# Patient Record
Sex: Female | Born: 1970
Health system: Southern US, Community
[De-identification: ages and names within clinical notes are randomized; demographics above are authoritative.]

## PROBLEM LIST (undated history)

## (undated) DIAGNOSIS — N189 Chronic kidney disease, unspecified: Secondary | ICD-10-CM

## (undated) DIAGNOSIS — N200 Calculus of kidney: Secondary | ICD-10-CM

## (undated) DIAGNOSIS — E079 Disorder of thyroid, unspecified: Secondary | ICD-10-CM

## (undated) HISTORY — PX: BREAST SURGERY: SHX581

## (undated) HISTORY — DX: Chronic kidney disease, unspecified: N18.9

## (undated) HISTORY — DX: Calculus of kidney: N20.0

## (undated) HISTORY — DX: Disorder of thyroid, unspecified: E07.9

---

## 1997-10-20 ENCOUNTER — Other Ambulatory Visit: Admission: RE | Admit: 1997-10-20 | Discharge: 1997-10-20 | Payer: Self-pay | Admitting: Obstetrics and Gynecology

## 1998-05-06 ENCOUNTER — Inpatient Hospital Stay (HOSPITAL_COMMUNITY): Admission: AD | Admit: 1998-05-06 | Discharge: 1998-05-06 | Payer: Self-pay | Admitting: Obstetrics and Gynecology

## 1998-05-12 ENCOUNTER — Inpatient Hospital Stay (HOSPITAL_COMMUNITY): Admission: AD | Admit: 1998-05-12 | Discharge: 1998-05-14 | Payer: Self-pay | Admitting: Obstetrics and Gynecology

## 1998-06-12 ENCOUNTER — Other Ambulatory Visit: Admission: RE | Admit: 1998-06-12 | Discharge: 1998-06-12 | Payer: Self-pay | Admitting: Obstetrics and Gynecology

## 1999-06-14 ENCOUNTER — Encounter: Admission: RE | Admit: 1999-06-14 | Discharge: 1999-06-14 | Payer: Self-pay | Admitting: *Deleted

## 1999-06-14 ENCOUNTER — Encounter: Payer: Self-pay | Admitting: Allergy and Immunology

## 2000-02-05 ENCOUNTER — Other Ambulatory Visit: Admission: RE | Admit: 2000-02-05 | Discharge: 2000-02-05 | Payer: Self-pay | Admitting: Obstetrics and Gynecology

## 2001-09-24 ENCOUNTER — Other Ambulatory Visit: Admission: RE | Admit: 2001-09-24 | Discharge: 2001-09-24 | Payer: Self-pay | Admitting: Obstetrics and Gynecology

## 2003-01-23 ENCOUNTER — Other Ambulatory Visit: Admission: RE | Admit: 2003-01-23 | Discharge: 2003-01-23 | Payer: Self-pay | Admitting: Obstetrics and Gynecology

## 2005-12-03 ENCOUNTER — Ambulatory Visit: Payer: Self-pay | Admitting: "Endocrinology

## 2008-02-10 ENCOUNTER — Ambulatory Visit: Payer: Self-pay | Admitting: Internal Medicine

## 2009-01-05 ENCOUNTER — Ambulatory Visit: Payer: Self-pay | Admitting: Internal Medicine

## 2009-02-12 ENCOUNTER — Ambulatory Visit: Payer: Self-pay | Admitting: Internal Medicine

## 2009-03-20 ENCOUNTER — Ambulatory Visit: Payer: Self-pay | Admitting: Internal Medicine

## 2009-09-27 ENCOUNTER — Ambulatory Visit: Payer: Self-pay | Admitting: Internal Medicine

## 2009-10-05 ENCOUNTER — Ambulatory Visit: Payer: Self-pay | Admitting: Internal Medicine

## 2009-11-23 ENCOUNTER — Ambulatory Visit: Payer: Self-pay | Admitting: Internal Medicine

## 2010-01-08 ENCOUNTER — Ambulatory Visit: Payer: Self-pay | Admitting: Internal Medicine

## 2010-03-15 ENCOUNTER — Ambulatory Visit: Payer: Self-pay | Admitting: Internal Medicine

## 2010-11-20 ENCOUNTER — Telehealth: Payer: Self-pay

## 2010-11-20 MED ORDER — AZITHROMYCIN 250 MG PO TABS: ORAL_TABLET | ORAL | Status: AC

## 2010-11-27 NOTE — Telephone Encounter (Signed)
rx called to pharmacy, as ordered per Dr. Lenord Fellers

## 2010-11-28 ENCOUNTER — Telehealth: Payer: Self-pay | Admitting: Internal Medicine

## 2010-11-28 NOTE — Telephone Encounter (Signed)
Pull charts so I can get it correct. Is she going to pick these up? We need correct mailing address for all.

## 2010-11-29 NOTE — Telephone Encounter (Signed)
Called pt and advised her RXs are ready to be picked up. She stated she will come by today

## 2011-04-04 ENCOUNTER — Telehealth: Payer: Self-pay | Admitting: Internal Medicine

## 2011-04-04 NOTE — Telephone Encounter (Signed)
Called patient and left voicemail that we have samples of Pristiq for her to pick up.  Also phoned in one refill of Sythroid and patient needs to have TSH checked per Dr. Lenord Fellers.

## 2011-04-04 NOTE — Telephone Encounter (Signed)
Needs TSH checked . Have samples of Pristiq 50 mg daily.

## 2011-04-09 DIAGNOSIS — E039 Hypothyroidism, unspecified: Secondary | ICD-10-CM

## 2011-06-23 ENCOUNTER — Telehealth: Payer: Self-pay | Admitting: Internal Medicine

## 2011-06-23 NOTE — Telephone Encounter (Signed)
Pt advised, per Dr. Lenord Fellers, to call gynecologist regarding birth control meds.  Pt verbalized understanding.

## 2011-07-31 ENCOUNTER — Telehealth: Payer: Self-pay | Admitting: Internal Medicine

## 2011-07-31 DIAGNOSIS — E039 Hypothyroidism, unspecified: Secondary | ICD-10-CM | POA: Insufficient documentation

## 2011-07-31 DIAGNOSIS — G4700 Insomnia, unspecified: Secondary | ICD-10-CM

## 2011-07-31 NOTE — Telephone Encounter (Signed)
Patient complaining of anxiety and insomnia. Has not been able to sleep in several weeks. Has been remodeling her kitchen. Call in Xanax 0.5 mg #30 one by mouth each bedtime to [facility]. She is aware she needs to come in in the near future to have TSH checked.

## 2011-08-15 ENCOUNTER — Telehealth: Payer: Self-pay | Admitting: Internal Medicine

## 2011-08-15 MED ORDER — AMPHETAMINE-DEXTROAMPHET ER 30 MG PO CP24: 30.0000 mg | ORAL_CAPSULE | ORAL | Status: DC

## 2011-08-15 NOTE — Telephone Encounter (Signed)
RX's written for both Jennifer Nicholson and Jennifer Nicholson for Adderall.  Written RX in an envelope left in the mailbox at the front door for [place] to pick up.  Voice mail left for patient.

## 2011-08-18 ENCOUNTER — Other Ambulatory Visit: Payer: Self-pay | Admitting: Internal Medicine

## 2011-08-18 DIAGNOSIS — E049 Nontoxic goiter, unspecified: Secondary | ICD-10-CM

## 2011-08-19 LAB — TSH: TSH: 0.543 u[IU]/mL (ref 0.350–4.500)

## 2011-09-02 ENCOUNTER — Other Ambulatory Visit: Payer: Self-pay

## 2011-09-02 ENCOUNTER — Telehealth: Payer: Self-pay | Admitting: Internal Medicine

## 2011-09-02 DIAGNOSIS — R41840 Attention and concentration deficit: Secondary | ICD-10-CM

## 2011-09-02 MED ORDER — LEVOTHYROXINE SODIUM 100 MCG PO TABS: 100.0000 ug | ORAL_TABLET | Freq: Every day | ORAL | Status: DC

## 2011-09-02 MED ORDER — AMPHETAMINE-DEXTROAMPHETAMINE 30 MG PO TABS: 30.0000 mg | ORAL_TABLET | Freq: Every day | ORAL | Status: DC

## 2011-09-02 NOTE — Telephone Encounter (Addendum)
Patient needs refill on Adderall( not XR) 30 mg daily. Prescription written for #30 with no refill. TSH was drawn recently in followup of hypothyroidism.

## 2011-09-04 ENCOUNTER — Other Ambulatory Visit: Payer: Self-pay | Admitting: Internal Medicine

## 2011-09-04 NOTE — Telephone Encounter (Signed)
Recent TSH within normal limits strong in April. Continue Synthroid 0.1 mg daily. Call in Synthroid 0.1 mg #30 with 5 refills 1 by mouth daily to [facility] on [facility]

## 2011-12-25 ENCOUNTER — Telehealth: Payer: Self-pay | Admitting: Internal Medicine

## 2011-12-25 NOTE — Telephone Encounter (Signed)
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 Patient says they started after boyfriend had a boil a couple of years ago. She has not had any recent outbreaks. Today has carbuncle right TM which is draining yellow-green pus. She started on doxycycline which she had on hand from previous infections. Has only been taking it since August 18. Has applied hot compresses to chin area. She is concerned about these recurrences. She does have Bactroban to put in her nostrils but has not been doing that recently. Recommended that she bathes daily in Hibiclens soap, wash linens in hot water, continue to put Bactroban in nostrils at bedtime. Continue doxycycline for 2-3 weeks.

## 2012-01-22 ENCOUNTER — Telehealth: Payer: Self-pay | Admitting: Internal Medicine

## 2012-01-22 MED ORDER — AMPHETAMINE-DEXTROAMPHETAMINE 30 MG PO TABS: 30.0000 mg | ORAL_TABLET | Freq: Every day | ORAL | Status: DC

## 2012-01-22 MED ORDER — ALPRAZOLAM 0.5 MG PO TABS: 0.5000 mg | ORAL_TABLET | Freq: Every evening | ORAL | Status: DC | PRN

## 2012-02-09 ENCOUNTER — Other Ambulatory Visit: Payer: Self-pay

## 2012-02-09 MED ORDER — LORAZEPAM 2 MG PO TABS: 2.0000 mg | ORAL_TABLET | Freq: Every evening | ORAL | Status: DC | PRN

## 2012-04-06 ENCOUNTER — Telehealth: Payer: Self-pay | Admitting: Internal Medicine

## 2012-04-06 NOTE — Telephone Encounter (Signed)
Call in Sterapred DS 10 mg 6 day dosepack.n Take as directed.

## 2012-04-06 NOTE — Telephone Encounter (Signed)
Spoke w/Stephanie @ [facility] [phone] and requested Sterapred as advised by Dr. Lenord Fellers.  Spoke w/patient to advise Rx has been called in.

## 2012-05-07 ENCOUNTER — Other Ambulatory Visit: Payer: Self-pay

## 2012-05-07 MED ORDER — LEVOTHYROXINE SODIUM 100 MCG PO TABS: 100.0000 ug | ORAL_TABLET | Freq: Every day | ORAL | Status: DC

## 2012-05-31 ENCOUNTER — Telehealth: Payer: Self-pay | Admitting: Internal Medicine

## 2012-05-31 NOTE — Telephone Encounter (Signed)
Patient called with a two-week history of URI symptoms. Coughing up discolored sputum. Has had laryngitis malaise and fatigue but no documented fever. Call in Zithromax Z-PAK to [facility].

## 2012-10-08 ENCOUNTER — Other Ambulatory Visit: Payer: Self-pay

## 2012-10-08 ENCOUNTER — Other Ambulatory Visit: Payer: Self-pay | Admitting: Internal Medicine

## 2012-10-08 MED ORDER — SYNTHROID 100 MCG PO TABS: 100.0000 ug | ORAL_TABLET | Freq: Every day | ORAL | Status: DC

## 2012-10-08 NOTE — Telephone Encounter (Signed)
Spoke with patient today, and she states she has been out of Synthroid 5 or 6 days. Requesting refill.

## 2012-10-08 NOTE — Telephone Encounter (Signed)
Needs TSH checked. Please call her

## 2012-10-08 NOTE — Telephone Encounter (Signed)
OK per Dr. Lenord Fellers to refill for 30 days only. Must come in for TSH before running out. She voices understanding.

## 2012-11-02 ENCOUNTER — Other Ambulatory Visit (INDEPENDENT_AMBULATORY_CARE_PROVIDER_SITE_OTHER): Payer: No Typology Code available for payment source | Admitting: Internal Medicine

## 2012-11-02 ENCOUNTER — Other Ambulatory Visit: Payer: Self-pay | Admitting: Internal Medicine

## 2012-11-02 DIAGNOSIS — E039 Hypothyroidism, unspecified: Secondary | ICD-10-CM

## 2012-11-03 ENCOUNTER — Telehealth: Payer: Self-pay | Admitting: Internal Medicine

## 2012-11-03 LAB — TSH: TSH: 0.013 u[IU]/mL — ABNORMAL LOW (ref 0.350–4.500)

## 2012-11-03 MED ORDER — LEVOTHYROXINE SODIUM 88 MCG PO TABS: 88.0000 ug | ORAL_TABLET | Freq: Every day | ORAL | Status: DC

## 2012-11-03 NOTE — Telephone Encounter (Signed)
Pt not feeling well has weight gain, hot flashes, arthralgias. Is on Synthroid 0.1  mg daily and TSH done yesterday is low suggesting over replacement of thyroid hormone. Has seen Dr. Fransico Michael in the past who advised low dose thyroid replacement. We are going to decrease dose to 0.088 mg daily and follow up in 6 weeks. She will also have sed rate, CBC,  CCP, ANA, RF,  C-met, Fe TIBC added to labs done yesterday. Says GYN has her on hormone replacement.

## 2012-11-04 ENCOUNTER — Other Ambulatory Visit: Payer: Self-pay

## 2012-11-04 LAB — COMPREHENSIVE METABOLIC PANEL
ALT: 64 U/L — ABNORMAL HIGH (ref 0–35)
CO2: 25 mEq/L (ref 19–32)
Calcium: 9.1 mg/dL (ref 8.4–10.5)
Chloride: 104 mEq/L (ref 96–112)
Creat: 0.79 mg/dL (ref 0.50–1.10)
Total Protein: 6.5 g/dL (ref 6.0–8.3)

## 2012-11-04 LAB — IRON AND TIBC: %SAT: 18 % — ABNORMAL LOW (ref 20–55)

## 2012-11-04 LAB — RHEUMATOID FACTOR: Rhuematoid fact SerPl-aCnc: <10 IU/mL (ref <=14)

## 2012-11-16 ENCOUNTER — Telehealth: Payer: Self-pay

## 2012-11-16 DIAGNOSIS — A4902 Methicillin resistant Staphylococcus aureus infection, unspecified site: Secondary | ICD-10-CM

## 2012-11-16 MED ORDER — DOXYCYCLINE HYCLATE 100 MG PO CAPS: 100.0000 mg | ORAL_CAPSULE | Freq: Two times a day (BID) | ORAL | Status: DC

## 2012-11-16 NOTE — Telephone Encounter (Signed)
Patient calls today stating she has a MRSA infection on her inner thigh. Reddened tender area that hurts when she walks. Cannot come in for a visit. Per verbal order Dr. Lenord Fellers, rx Doxycycline 100mg  po bid x 15 days. Sent to [facility]. Patient notified.

## 2012-12-31 ENCOUNTER — Other Ambulatory Visit: Payer: Self-pay

## 2012-12-31 MED ORDER — ALPRAZOLAM 0.5 MG PO TABS: 0.5000 mg | ORAL_TABLET | Freq: Two times a day (BID) | ORAL | Status: DC | PRN

## 2013-02-22 ENCOUNTER — Other Ambulatory Visit: Payer: No Typology Code available for payment source

## 2013-02-22 ENCOUNTER — Telehealth: Payer: Self-pay | Admitting: Endocrinology

## 2013-02-22 NOTE — Telephone Encounter (Signed)
Pt no showed today's lab appt, has follow up scheduled with Dr. Lucianne Muss 02/24/13  Roanna Raider

## 2013-02-24 ENCOUNTER — Ambulatory Visit (INDEPENDENT_AMBULATORY_CARE_PROVIDER_SITE_OTHER): Payer: No Typology Code available for payment source | Admitting: Endocrinology

## 2013-02-24 ENCOUNTER — Encounter: Payer: Self-pay | Admitting: Endocrinology

## 2013-02-24 VITALS — BP 132/88 | HR 88 | Temp 98.5°F | Resp 12 | Ht 63.0 in | Wt 169.0 lb

## 2013-02-24 DIAGNOSIS — D509 Iron deficiency anemia, unspecified: Secondary | ICD-10-CM

## 2013-02-24 DIAGNOSIS — E039 Hypothyroidism, unspecified: Secondary | ICD-10-CM

## 2013-02-24 DIAGNOSIS — N951 Menopausal and female climacteric states: Secondary | ICD-10-CM

## 2013-02-24 LAB — CBC WITH DIFFERENTIAL/PLATELET
Basophils Absolute: 0 10*3/uL (ref 0.0–0.1)
Eosinophils Relative: 1.1 % (ref 0.0–5.0)
Lymphocytes Relative: 19.2 % (ref 12.0–46.0)
Lymphs Abs: 1.9 10*3/uL (ref 0.7–4.0)
Monocytes Relative: 5.3 % (ref 3.0–12.0)
Neutrophils Relative %: 74.1 % (ref 43.0–77.0)
Platelets: 179 10*3/uL (ref 150.0–400.0)
RDW: 13.2 % (ref 11.5–14.6)
WBC: 10 10*3/uL (ref 4.5–10.5)

## 2013-02-24 NOTE — Progress Notes (Signed)
Reason for Appointment:  Hypothyroidism, new visit   History of Present Illness:   Problem 1:  HYPOTHYROIDISM: She apparently had a goiter initially several years ago and then subsequently was told that she was hypothyroid also. No records are available of baseline testing However her on 6-7 years ago she started having symptoms of significant fatigue, feeling sleepy, having brittle hair and dry skin. She did not have any complaints of cold intolerance She was started on thyroid supplementation at some point but she felt only slightly better with thyroid supplements Her dose has been ranging from 88-112 mcg Synthroid  She now comes in complaining of feeling exhausted. She feels sleepy during the day and has difficulty waking up in the morning She also has had progressive weight gain and she thinks she has gained about 40 pounds this year She he has not been able to exercise regularly but tries to go for walks periodically She also complains of her muscles being sore and tender Complains of feeling hot most of the time  She had a TSH done in July and because this was low her dose was reduced from 100 down to 88 mcg but she has not had any followup levels done  No visits with results within 1 Week(s) from this visit. Latest known visit with results is:  Lab on 11/02/2012  Component Date Value Range Status  . TSH 11/02/2012 0.013* 0.350 - 4.500 uIU/mL Final     PROBLEM #2:  She is complaining about progressive weight gain. Does not have any weakness in her legs, easy bruising, facial hair or hair loss    Past Medical History  Diagnosis Date  . Thyroid disease     History reviewed. No pertinent past surgical history.  Family History  Problem Relation Age of Onset  . Thyroid disease Mother   . Thyroid disease Sister   . Thyroid disease Maternal Grandmother     Social History:  reports that she has never smoked. She has never used smokeless tobacco. Her alcohol and drug  histories are not on file.  Allergies:  Allergies  Allergen Reactions  . Hydrocodone Itching      Medication List       This list is accurate as of: 02/24/13  2:25 PM.  Always use your most recent med list.               DYMISTA 137-50 MCG/ACT Susp  Generic drug:  Azelastine-Fluticasone     levothyroxine 88 MCG tablet  Commonly known as:  SYNTHROID, LEVOTHROID  Take 1 tablet (88 mcg total) by mouth daily.        Review of Systems:  She has had problems with sinus headaches occasionally but no other unusual headaches She has allergic rhinitis No complaints of hair loss or acne No change in bowel habits  Menstrual cycles: Every 5-6 weeks but lasting only for about one day She was put on hormone supplements last year by her gynecologist because of significant hot flashes She stopped these a few months ago because of weight gain, still having some hot flashes but not as significant  She has  no history of high blood pressure.            ENDOCRINOLOGY:  no history of Diabetes.    She complains of joint pains especially in her fingers. Also has tenderness in her muscles of the arm or back    She has no insomnia but she tends to sleep excessively. She does not  think she has any depressed mood or mood swings but she is upset about her weight gain and does not like to go out much    Examination:    BP 132/88  Pulse 88  Temp(Src) 98.5 F (36.9 C)  Resp 12  Ht 5\' 3"  (1.6 m)  Wt 169 lb (76.658 kg)  BMI 29.94 kg/m2  SpO2 99%  LMP 01/25/2013   General Appearance: pleasant, somewhat anxious. She has generalized obesity No facial plethora, moon facies.     Eyes:  externally normal without any prominence or eyelid swelling .   ENT: Oral mucosa normal         Neck:  she has no buffalo hump or supraclavicular fat pads The thyroid is nonpalpable. There is no lymphadenopathy .    Cardiovascular: Normal apex and heart sounds, no murmur Respiratory:  Lungs  clear Gastrointestinal: abdomen soft, no striae seen. No hepatosplenomegaly or other mass Neurological: REFLEXES: at biceps are normal.     Skin: warm, no rash. She has no thinning of the skin on forearms Extremities: No ankle edema, no pretibial skin thickening   Assessments   1. Hypothyroidism by history although unable to confirm that she was truly hypothyroid at diagnosis and no records are available Subjectively she has not had any benefit in her fatigue with taking thyroid supplements over the last several years even when her thyroid levels were normal She appeared to be over replaced significantly with a nearly undetectable TSH in July and followup thyroid levels on the new dose of 88 mcg had not been done yet Currently does not have a goiter Most likely does have Hashimoto's thyroiditis especially with a history of goiter and significant family history of thyroid disease  2. Fatigue, muscle aches, joint pains. Most likely she has fibromyalgia  3. Fatigue and excessive somnolence: Although she denies any depression she does appear to have lack of motivation. She also has had a history of seasonal affective disorder in the past  4. Weight gain: She does not appear to have any cushingoid features either via signs or symptoms on exam. Most likely this is related to her fatigue and mild depression. No endocrine cause likely  5.? Early menopause. She is having significant hot flashes and history of oligomenorrhea, not clear what workup she has had from gynecologist previously  PLAN: 1. Check thyroid levels today including free T4 2. Change thyroid dosage if needed 3. Check FSH, estradiol and prolactin levels 4. Consider antidepressants. However she thinks that she has tried Wellbutrin and other agents previously without much benefit. Have discussed that she needs to followup with primary care physician for an office visit for further evaluation of multiple medical issues 5. Will check  CBC for her iron deficiency and forward to PCP. Consider checking B12 level if she has persistent anemia or macrocytosis 6. May be a candidate for weight loss drugs since her weight gain appears to be a distressing symptom, she can discuss with PCP   [facility] 02/24/2013, 2:25 PM   Addendum: All labs are normal, patient informed, she will followup with PCP in the office for continued management

## 2013-02-25 LAB — TSH: TSH: 0.46 u[IU]/mL (ref 0.35–5.50)

## 2013-02-25 LAB — T4, FREE: Free T4: 1.22 ng/dL (ref 0.60–1.60)

## 2013-02-25 LAB — ESTRADIOL: Estradiol: 359 pg/mL

## 2013-02-26 ENCOUNTER — Encounter: Payer: Self-pay | Admitting: Endocrinology

## 2013-02-28 ENCOUNTER — Other Ambulatory Visit: Payer: Self-pay | Admitting: Endocrinology

## 2013-02-28 ENCOUNTER — Telehealth: Payer: Self-pay | Admitting: *Deleted

## 2013-02-28 MED ORDER — THYROID 90 MG PO TABS: ORAL_TABLET | ORAL | Status: DC

## 2013-02-28 NOTE — Telephone Encounter (Signed)
Pt says someone called her with lab results, I can't find them, please advise

## 2013-02-28 NOTE — Telephone Encounter (Signed)
She was advised to followup with Dr. Lenord Fellers for general medical evaluation and further management Meanwhile she can take for 2 months trial of Armour Thyroid instead of Synthroid to see if her fatigue is better, prescription called in for 90 mg, to take half tablet 3 times a week and full  tablet 4 days a week

## 2013-03-02 ENCOUNTER — Telehealth: Payer: Self-pay | Admitting: Internal Medicine

## 2013-03-02 ENCOUNTER — Encounter: Payer: Self-pay | Admitting: *Deleted

## 2013-03-02 NOTE — Telephone Encounter (Signed)
Pt. Is divorced single mother raising 2 teen age daughters and pt has steady boyfriend. Ex-husband not paying her child support. She is always stressed and insists something is wrong with her physically. Complains of weight gain but is not exercising. Saw Dr. Lucianne Muss recently for endocrine evaluation. Has tried antidepressants , Adderall, Xanax. Did not like antidepressants we tried such as Pristiq, and said Adderall made her feel odd. 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 Has bought 4 mattresses in the past year for poor sleep. Insistant she has gained 50 pounds over past few years. Drinks alcohol; but denies excessive alcohol intake. Saw GYN for "hormone" issues. Mother had a stroke recently and is in rehab in [place]. We have seen her infrequently because she had poor insurance coverage for a while.  I have told pt. I think she could benefit from antidepressants as does Dr. Lucianne Muss. Needs evaluation and med consultation.

## 2013-03-02 NOTE — Telephone Encounter (Signed)
Left message on voice mail that all test results were in the normal range and for her to follow up with her PCP on fibromyalgia and possible weight loss solutions.

## 2013-03-02 NOTE — Telephone Encounter (Signed)
Message copied by Baldwin Jamaica on Wed Mar 02, 2013  4:35 PM ------      Message from: Reather Littler      Created: Fri Feb 25, 2013  3:18 PM       All the tests done are normal, namely female hormone levels, tests for anemia and thyroid levels      No hormonal causes for her fatigue. She needs to go back to her primary care physician for management of her fibromyalgia and considering weight loss medicines ------

## 2013-04-11 ENCOUNTER — Other Ambulatory Visit: Payer: Self-pay | Admitting: *Deleted

## 2013-04-11 ENCOUNTER — Other Ambulatory Visit (INDEPENDENT_AMBULATORY_CARE_PROVIDER_SITE_OTHER): Payer: No Typology Code available for payment source

## 2013-04-11 DIAGNOSIS — E039 Hypothyroidism, unspecified: Secondary | ICD-10-CM

## 2013-04-11 LAB — T4, FREE: Free T4: 0.58 ng/dL — ABNORMAL LOW (ref 0.60–1.60)

## 2013-04-18 ENCOUNTER — Encounter: Payer: Self-pay | Admitting: Endocrinology

## 2013-04-18 ENCOUNTER — Other Ambulatory Visit: Payer: Self-pay | Admitting: *Deleted

## 2013-04-18 ENCOUNTER — Ambulatory Visit (INDEPENDENT_AMBULATORY_CARE_PROVIDER_SITE_OTHER): Payer: No Typology Code available for payment source | Admitting: Endocrinology

## 2013-04-18 ENCOUNTER — Telehealth: Payer: Self-pay | Admitting: *Deleted

## 2013-04-18 VITALS — BP 124/84 | HR 85 | Temp 98.3°F | Resp 12 | Ht 63.0 in | Wt 171.2 lb

## 2013-04-18 DIAGNOSIS — E0500 Thyrotoxicosis with diffuse goiter without thyrotoxic crisis or storm: Secondary | ICD-10-CM

## 2013-04-18 DIAGNOSIS — R635 Abnormal weight gain: Secondary | ICD-10-CM

## 2013-04-18 DIAGNOSIS — R5381 Other malaise: Secondary | ICD-10-CM

## 2013-04-18 DIAGNOSIS — Z23 Encounter for immunization: Secondary | ICD-10-CM

## 2013-04-18 DIAGNOSIS — E039 Hypothyroidism, unspecified: Secondary | ICD-10-CM

## 2013-04-18 MED ORDER — BUPROPION HCL ER (XL) 150 MG PO TB24
ORAL_TABLET | ORAL | Status: DC
Start: 2013-04-18 — End: 2013-06-23

## 2013-04-18 MED ORDER — THYROID 90 MG PO TABS: ORAL_TABLET | ORAL | Status: DC

## 2013-04-18 NOTE — Patient Instructions (Signed)
Take full tab on Wednesdays also

## 2013-04-18 NOTE — Telephone Encounter (Signed)
Pt called and said TSH was normal and her other Dr. Catalina Pizza her if she wanted to lose wait to take Welbutrin.

## 2013-04-18 NOTE — Progress Notes (Signed)
Jennifer Nicholson  Reason for Appointment:  Hypothyroidism, followup visit   History of Present Illness:   Problem 1:  HYPOTHYROIDISM: She apparently had a goiter initially several years ago and about 8-9 years ago was told that she was hypothyroid also  No records are available of baseline TSH She was started on thyroid supplementation at some point but she felt only slightly better with thyroid supplements Her dose has been ranging from 88-112 mcg Synthroid She is still complaining of feeling exhausted.  Complains of feeling hot most of the time  In July 2014 her dose was reduced from 100 down to 88 mcg and her TSH was low normal in 10/14 Because of her complaints of fatigue she was empirically started on Armour Thyroid and 10/14. She does not feel any better with her fatigue but she has had less muscle soreness and less pain in her joints  Also she has been told by her friends that her eyes are more prominent in the last few months  No visits with results within 1 Week(s) from this visit. Latest known visit with results is:  Appointment on 04/11/2013  Component Date Value Range Status  . TSH 04/11/2013 2.88  0.35 - 5.50 uIU/mL Final  . Free T4 04/11/2013 0.58* 0.60 - 1.60 ng/dL Final    PROBLEM 2:  She is complaining about continued weight gain.She  has had progressive weight gain and she thinks she has gained about 40 pounds this year  She he has not been able to exercise regularly but tries to go for walks periodically   Wt Readings from Last 3 Encounters:  04/18/13 171 lb 3.2 oz (77.656 kg)  02/24/13 169 lb (76.658 kg)      Past Medical History  Diagnosis Date  . Thyroid disease     No past surgical history on file.  Family History  Problem Relation Age of Onset  . Thyroid disease Mother   . Thyroid disease Sister   . Thyroid disease Maternal Grandmother     Social History:  reports that she has never smoked. She has never used smokeless tobacco. Her  alcohol and drug histories are not on file.  Allergies:  Allergies  Allergen Reactions  . Hydrocodone Itching      Medication List       This list is accurate as of: 04/18/13 11:18 AM.  Always use your most recent med list.               ALPRAZolam 0.5 MG tablet  Commonly known as:  Prudy Feeler     DYMISTA 137-50 MCG/ACT Susp  Generic drug:  Azelastine-Fluticasone     thyroid 90 MG tablet  Commonly known as:  ARMOUR THYROID  To take half tablet on Monday, Wednesday and Friday and full tablet on the other days        Review of Systems:  She has no insomnia but she tends to sleep excessively. She does not think she has any depressed mood or mood swings but she is upset about her weight gain and does not like to go out much  Labs on the last visit were unremarkable including CBC    Examination:    BP 124/84  Pulse 85  Temp(Src) 98.3 F (36.8 C)  Resp 12  Ht 5\' 3"  (1.6 m)  Wt 171 lb 3.2 oz (77.656 kg)  BMI 30.33 kg/m2  SpO2 97%  LMP 04/02/2013   General Appearance:  She has generalized obesity  She has mild  stare on the right side but no lid retraction. Minimal swelling of the eyelids Exophthalmometry shows left side measuring 21 mm and right side 19, normal 18  The thyroid is nonpalpable    Neurological: REFLEXES: at biceps are normal.        Assessments   1. Hypothyroidism by history. Subjectively she is not better with Armour Thyroid and advised her that her fatigue is likely to be unrelated to hypothyroidism  Since TSH is 2.9 will have her take an extra half tablet once a week of her 90 mg dosage. This will make her taking a full tablet 5 days a week She will need to have followup TSH levels in 4-6 months  2. She appears to have Graves' ophthalmopathy. This is relatively mild but asymptomatic. Discussed with her that this is somewhat independent of her hypothyroidism. As long as her TSH is within the normal range it should not have any effect on her  ocular problems. However she needs more complete evaluation from her ophthalmologist Dr. Delaney Meigs to rule out local causes.  3. Significant fatigue. She should discuss this with her PCP. She is a good candidate for medications like Wellbutrin which may also help somewhat with weight loss  4. Weight gain:  Most likely this is related to her fatigue and mild depression. No endocrine cause likely. She will discuss further with PCP. Consider weight loss medication since she is quite upset about her progressive weight gain  Total visit time including counseling = 25 minutes  Marqueta Pulley 04/18/2013, 11:18 AM

## 2013-06-23 ENCOUNTER — Other Ambulatory Visit: Payer: Self-pay | Admitting: Internal Medicine

## 2013-08-15 ENCOUNTER — Other Ambulatory Visit: Payer: No Typology Code available for payment source

## 2013-08-18 ENCOUNTER — Ambulatory Visit: Payer: No Typology Code available for payment source | Admitting: Endocrinology

## 2013-08-18 ENCOUNTER — Encounter: Payer: Self-pay | Admitting: *Deleted

## 2013-09-09 ENCOUNTER — Telehealth: Payer: Self-pay | Admitting: Internal Medicine

## 2013-09-09 NOTE — Telephone Encounter (Signed)
Patient has been told by GYN nurse practitioner that she is menopausal. She's also been given short course of phentermine for weight loss. She's been playing tennis frequently. Hasn't lost a lot of weight.  Today, has paronychia of finger. Has been putting out pine straw & noticed this after doing that. History of MRSA. Call in doxycycline 100 mg twice daily for 10 days

## 2014-01-27 ENCOUNTER — Other Ambulatory Visit: Payer: Self-pay | Admitting: Endocrinology

## 2014-05-19 ENCOUNTER — Other Ambulatory Visit: Payer: Self-pay | Admitting: Internal Medicine

## 2014-05-19 NOTE — Telephone Encounter (Signed)
Xanax refill sent to pharmacy 

## 2014-05-19 NOTE — Telephone Encounter (Signed)
Refill once 

## 2014-05-23 ENCOUNTER — Telehealth: Payer: Self-pay | Admitting: *Deleted

## 2014-05-23 NOTE — Telephone Encounter (Signed)
Left message for patient to call back  

## 2014-05-23 NOTE — Telephone Encounter (Signed)
We refilled her Xanax recently. If she starts taking something every night she will get rebound insomnia. Request denied.

## 2014-05-23 NOTE — Telephone Encounter (Signed)
Patient called states she wants something for sleep. She states she is going through menopause and is having difficulty  sleeping . She states she has tried OTC meds without success. Offered patient appt she states she just wants something called in to [facility]. Please advise.

## 2014-05-24 NOTE — Telephone Encounter (Signed)
Left message for patient that we would not recommend any medication for sleep at this time.

## 2015-01-30 ENCOUNTER — Emergency Department (HOSPITAL_COMMUNITY): Payer: BLUE CROSS/BLUE SHIELD

## 2015-01-30 ENCOUNTER — Encounter (HOSPITAL_COMMUNITY): Payer: Self-pay | Admitting: Emergency Medicine

## 2015-01-30 ENCOUNTER — Emergency Department (HOSPITAL_COMMUNITY)
Admission: EM | Admit: 2015-01-30 | Discharge: 2015-01-31 | Disposition: A | Discharge status: Home / Self Care | Payer: BLUE CROSS/BLUE SHIELD | Attending: Physician Assistant | Admitting: Physician Assistant

## 2015-01-30 DIAGNOSIS — N200 Calculus of kidney: Secondary | ICD-10-CM | POA: Diagnosis not present

## 2015-01-30 DIAGNOSIS — R109 Unspecified abdominal pain: Secondary | ICD-10-CM | POA: Diagnosis present

## 2015-01-30 DIAGNOSIS — Z8639 Personal history of other endocrine, nutritional and metabolic disease: Secondary | ICD-10-CM | POA: Insufficient documentation

## 2015-01-30 DIAGNOSIS — Z3202 Encounter for pregnancy test, result negative: Secondary | ICD-10-CM | POA: Diagnosis not present

## 2015-01-30 DIAGNOSIS — Z79899 Other long term (current) drug therapy: Secondary | ICD-10-CM | POA: Insufficient documentation

## 2015-01-30 LAB — COMPREHENSIVE METABOLIC PANEL
ALBUMIN: 4.2 g/dL (ref 3.5–5.0)
ALT: 33 U/L (ref 14–54)
ANION GAP: 10 (ref 5–15)
AST: 31 U/L (ref 15–41)
Alkaline Phosphatase: 60 U/L (ref 38–126)
BUN: 10 mg/dL (ref 6–20)
CO2: 27 mmol/L (ref 22–32)
Calcium: 9.3 mg/dL (ref 8.9–10.3)
Chloride: 103 mmol/L (ref 101–111)
Creatinine, Ser: 0.95 mg/dL (ref 0.44–1.00)
GFR calc Af Amer: >60 mL/min (ref >60)
GFR calc non Af Amer: >60 mL/min (ref >60)
GLUCOSE: 131 mg/dL — AB (ref 65–99)
POTASSIUM: 3.9 mmol/L (ref 3.5–5.1)
SODIUM: 140 mmol/L (ref 135–145)
Total Bilirubin: 1.1 mg/dL (ref 0.3–1.2)
Total Protein: 7.1 g/dL (ref 6.5–8.1)

## 2015-01-30 LAB — URINALYSIS, ROUTINE W REFLEX MICROSCOPIC
GLUCOSE, UA: NEGATIVE mg/dL
Ketones, ur: 15 mg/dL — AB
Nitrite: POSITIVE — AB
PH: 8 (ref 5.0–8.0)
Protein, ur: 30 mg/dL — AB
SPECIFIC GRAVITY, URINE: 1.024 (ref 1.005–1.030)
Urobilinogen, UA: 1 mg/dL (ref 0.0–1.0)

## 2015-01-30 LAB — CBC
HEMATOCRIT: 44.2 % (ref 36.0–46.0)
HEMOGLOBIN: 15 g/dL (ref 12.0–15.0)
MCH: 33.1 pg (ref 26.0–34.0)
MCHC: 33.9 g/dL (ref 30.0–36.0)
MCV: 97.6 fL (ref 78.0–100.0)
Platelets: 189 10*3/uL (ref 150–400)
RBC: 4.53 MIL/uL (ref 3.87–5.11)
RDW: 13.4 % (ref 11.5–15.5)
WBC: 10.5 10*3/uL (ref 4.0–10.5)

## 2015-01-30 LAB — PREGNANCY, URINE: PREG TEST UR: NEGATIVE

## 2015-01-30 LAB — LIPASE, BLOOD: LIPASE: 30 U/L (ref 22–51)

## 2015-01-30 LAB — URINE MICROSCOPIC-ADD ON

## 2015-01-30 MED ORDER — DEXTROSE 5 % IV SOLN
1.0000 g | Freq: Once | INTRAVENOUS | Status: AC
  Administered 2015-01-30: 1 g via INTRAVENOUS
  Filled 2015-01-30: qty 10

## 2015-01-30 MED ORDER — CIPROFLOXACIN HCL 500 MG PO TABS: 500.0000 mg | ORAL_TABLET | Freq: Once | ORAL | Status: DC

## 2015-01-30 MED ORDER — ONDANSETRON HCL 4 MG PO TABS: 4.0000 mg | ORAL_TABLET | Freq: Three times a day (TID) | ORAL | Status: DC | PRN

## 2015-01-30 MED ORDER — KETOROLAC TROMETHAMINE 30 MG/ML IJ SOLN
30.0000 mg | Freq: Once | INTRAMUSCULAR | Status: AC
  Administered 2015-01-30: 30 mg via INTRAMUSCULAR
  Filled 2015-01-30: qty 1

## 2015-01-30 MED ORDER — OXYCODONE-ACETAMINOPHEN 5-325 MG PO TABS: 1.0000 | ORAL_TABLET | Freq: Four times a day (QID) | ORAL | Status: DC | PRN

## 2015-01-30 MED ORDER — OXYCODONE-ACETAMINOPHEN 5-325 MG PO TABS
1.0000 | ORAL_TABLET | Freq: Once | ORAL | Status: AC
  Administered 2015-01-31: 1 via ORAL
  Filled 2015-01-30: qty 1

## 2015-01-30 MED ORDER — CIPROFLOXACIN HCL 500 MG PO TABS: 500.0000 mg | ORAL_TABLET | Freq: Two times a day (BID) | ORAL | Status: DC

## 2015-01-30 MED ORDER — SODIUM CHLORIDE 0.9 % IV BOLUS (SEPSIS)
1000.0000 mL | Freq: Once | INTRAVENOUS | Status: AC
  Administered 2015-01-30: 1000 mL via INTRAVENOUS

## 2015-01-30 MED ORDER — ONDANSETRON 4 MG PO TBDP
4.0000 mg | ORAL_TABLET | Freq: Once | ORAL | Status: AC
  Administered 2015-01-30: 4 mg via ORAL
  Filled 2015-01-30: qty 1

## 2015-01-30 NOTE — ED Notes (Signed)
Pt sts LLQ pain with radiation to flank; pt sts N/V x 2 due to pain; pt sts some relief at present

## 2015-01-30 NOTE — ED Notes (Signed)
Patient describes severe sharp left lateral abdominal pain which radiated to her left flank. Pain was accompanied by vomiting x2. Was brought by daughter and afterward all symptoms have resolved. Denies urinary symptoms and fever. Currently feels normal. States no other recent illness.

## 2015-01-30 NOTE — ED Provider Notes (Signed)
CSN: [id number]     Arrival date & time 01/30/15  1722 History   First MD Initiated Contact with Patient 01/30/15 2009     Chief Complaint  Patient presents with  . Abdominal Pain  . Flank Pain     (Consider location/radiation/quality/duration/timing/severity/associated sxs/prior Treatment) HPI   Patient is a pleasant 44 year old female presenting with flank pain on the left radiating to her groin. Patient's never had this before. Patient had the intense pain and went to shower. She compared it to feeling a childbirth. It is now resolved. Patient has no history of kidney stones no family history of the same.  Past Medical History  Diagnosis Date  . Thyroid disease    History reviewed. No pertinent past surgical history. Family History  Problem Relation Age of Onset  . Thyroid disease Mother   . Thyroid disease Sister   . Thyroid disease Maternal Grandmother    Social History  Substance Use Topics  . Smoking status: Never Smoker   . Smokeless tobacco: Never Used  . Alcohol Use: None   OB History    No data available     Review of Systems  Constitutional: Negative for activity change and fatigue.  HENT: Negative for congestion and drooling.   Eyes: Negative for discharge.  Respiratory: Negative for cough and chest tightness.   Cardiovascular: Negative for chest pain.  Gastrointestinal: Positive for nausea, vomiting and abdominal pain. Negative for abdominal distention.  Genitourinary: Positive for flank pain. Negative for dysuria, frequency, difficulty urinating and dyspareunia.  Musculoskeletal: Negative for joint swelling.  Skin: Negative for rash.  Allergic/Immunologic: Negative for immunocompromised state.  Psychiatric/Behavioral: Negative for behavioral problems and agitation.      Allergies  Hydrocodone  Home Medications   Prior to Admission medications   Medication Sig Start Date End Date Taking? Authorizing Provider  ALPRAZolam (XANAX) 0.5 MG tablet  TAKE 1 TABLET BY MOUTH TWICE A DAY AS NEEDED FOR ANXIETY. 05/19/14   Elby Showers, MD  ARMOUR THYROID 90 MG tablet TAKE 1/2 TABLET BY MOUTH ON MONDAY, WEDNESDAY, AND FRIDAY. TAKE 1 TABLET ON THE OTHER DAYS. 01/27/14   Elayne Snare, MD  buPROPion (WELLBUTRIN XL) 150 MG 24 hr tablet TAKE 1 TABLET BY MOUTH DAILY. 06/23/13   Elby Showers, MD  DYMISTA [phone] MCG/ACT SUSP  12/18/12   Historical Provider, MD  medroxyPROGESTERone (PROVERA) 10 MG tablet  07/08/13   Historical Provider, MD   BP 140/92 mmHg  Pulse 76  Temp(Src) 98.2 F (36.8 C) (Oral)  Resp 18  SpO2 99% Physical Exam  Constitutional: She is oriented to person, place, and time. She appears well-developed and well-nourished.  HENT:  Head: Normocephalic and atraumatic.  Eyes: Conjunctivae are normal. Right eye exhibits no discharge.  Neck: Neck supple.  Cardiovascular: Normal rate, regular rhythm and normal heart sounds.   No murmur heard. Pulmonary/Chest: Effort normal and breath sounds normal. She has no wheezes. She has no rales.  Abdominal: Soft. She exhibits no distension. There is no tenderness.  Musculoskeletal: Normal range of motion. She exhibits no edema.  Neurological: She is oriented to person, place, and time. No cranial nerve deficit.  Skin: Skin is warm and dry. No rash noted. She is not diaphoretic.  Psychiatric: She has a normal mood and affect. Her behavior is normal.  Nursing note and vitals reviewed.   ED Course  Procedures (including critical care time) Labs Review Labs Reviewed  COMPREHENSIVE METABOLIC PANEL - Abnormal; Notable for the following:  Glucose, Bld 131 (*)    All other components within normal limits  LIPASE, BLOOD  CBC  URINALYSIS, ROUTINE W REFLEX MICROSCOPIC (NOT AT [facility])    Imaging Review No results found. I have personally reviewed and evaluated these images and lab results as part of my medical decision-making.   EKG Interpretation None      MDM   Final diagnoses:  None     Patient is a 44 year old female with history of hypothyroidism presenting with flank pain radiating to her groin. It sounds typical for a kidney stone. However she has had no past history of that. We'll get a CT noncontrast. This will give her better idea of whether to expect more kidney stones in the future.  Patient's going through menopause, therefore lower suspicion for any kind of ovarian pathology.  11:46 PM CT shows 4 mm stone. Pt now vomiting and increased pain. Infected stone given ntirate postitive urine. Discsussed with urology. They recommend discharge and call [facility] in the morning.   Will give ceftriaxone, fluids, pain medication and nausea medication.   Shadasia Oldfield Julio Alm, MD 01/30/15 2348

## 2015-01-31 MED ORDER — FENTANYL CITRATE (PF) 100 MCG/2ML IJ SOLN
100.0000 ug | Freq: Once | INTRAMUSCULAR | Status: AC
  Administered 2015-01-31: 100 ug via INTRAVENOUS
  Filled 2015-01-31: qty 2

## 2015-01-31 MED ORDER — TAMSULOSIN HCL 0.4 MG PO CAPS
0.4000 mg | ORAL_CAPSULE | Freq: Every day | ORAL | Status: DC
  Administered 2015-01-31: 0.4 mg via ORAL
  Filled 2015-01-31: qty 1

## 2015-03-01 ENCOUNTER — Encounter (HOSPITAL_COMMUNITY): Payer: Self-pay | Admitting: Emergency Medicine

## 2015-03-01 ENCOUNTER — Emergency Department (HOSPITAL_COMMUNITY)
Admission: EM | Admit: 2015-03-01 | Discharge: 2015-03-01 | Disposition: A | Discharge status: Home / Self Care | Payer: No Typology Code available for payment source | Source: Home / Self Care | Attending: Family Medicine | Admitting: Family Medicine

## 2015-03-01 DIAGNOSIS — S61209A Unspecified open wound of unspecified finger without damage to nail, initial encounter: Secondary | ICD-10-CM | POA: Diagnosis not present

## 2015-03-01 NOTE — ED Notes (Signed)
Left index finger with avulsion injury to left, index fingertip

## 2015-03-01 NOTE — ED Notes (Signed)
Laceration to left index finger.

## 2015-03-01 NOTE — ED Provider Notes (Signed)
CSN: [id number]     Arrival date & time 03/01/15  1601 History   First MD Initiated Contact with Patient 03/01/15 1647     Chief Complaint  Patient presents with  . 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 This produced a superficial dermal avulsion approximately 4 mm across. She came in primarily because of the large amount of bleeding. The bleeding had mostly stopped by the time she arrived. She has normal sensation and function in the index finger. Her last tetanus was 6 years ago.   Past Medical History  Diagnosis Date  . Thyroid disease    History reviewed. No pertinent past surgical history. Family History  Problem Relation Age of Onset  . Thyroid disease Mother   . Thyroid disease Sister   . Thyroid disease Maternal Grandmother    Social History  Substance Use Topics  . Smoking status: Never Smoker   . Smokeless tobacco: Never Used  . Alcohol Use: Yes   OB History    No data available     Review of Systems  Constitutional: Negative.   Skin: Positive for wound.  Psychiatric/Behavioral: Negative.     Allergies  Hydrocodone  Home Medications   Prior to Admission medications   Medication Sig Start Date End Date Taking? Authorizing Provider  ALPRAZolam (XANAX) 0.5 MG tablet TAKE 1 TABLET BY MOUTH TWICE A DAY AS NEEDED FOR ANXIETY. 05/19/14   Elby Showers, MD  ciprofloxacin (CIPRO) 500 MG tablet Take 1 tablet (500 mg total) by mouth 2 (two) times daily. 01/30/15   Courteney Lyn Mackuen, MD  cyclobenzaprine (FLEXERIL) 10 MG tablet Take 10 mg by mouth daily as needed for muscle spasms.  11/09/14   Historical Provider, MD  escitalopram (LEXAPRO) 20 MG tablet Take 20 mg by mouth daily. 01/12/15   Historical Provider, MD  hydrochlorothiazide (HYDRODIURIL) 25 MG tablet  Take 25 mg by mouth daily. 12/08/14   Historical Provider, MD  levothyroxine (SYNTHROID, LEVOTHROID) 112 MCG tablet Take 112 mcg by mouth daily before breakfast.    Historical Provider, MD  norethindrone (MICRONOR,CAMILA,ERRIN) 0.35 MG tablet Take 1 tablet by mouth daily.    Historical Provider, MD  ondansetron (ZOFRAN) 4 MG tablet Take 1 tablet (4 mg total) by mouth every 8 (eight) hours as needed for nausea or vomiting. 01/30/15   Courteney Lyn Mackuen, MD  oxyCODONE-acetaminophen (PERCOCET/ROXICET) 5-325 MG tablet Take 1 tablet by mouth every 6 (six) hours as needed for severe pain. 01/30/15   Courteney Lyn Mackuen, MD  zolpidem (AMBIEN) 10 MG tablet Take 2.5-5 mg by mouth at bedtime as needed for sleep.  01/17/15   Historical Provider, MD   Meds Ordered and Administered this Visit  Medications - No data to display  BP 126/87 mmHg  Pulse 70  Temp(Src) 98 F (36.7 C) (Oral)  Resp 16  SpO2 99%  LMP 03/01/2015 No data found.   Physical Exam  Constitutional: She is oriented to person, place, and time. She appears well-developed and well-nourished. No distress.  Pulmonary/Chest: Effort normal. No respiratory distress.  Neurological: She is alert and oriented to person, place, and time.  Skin: Skin is warm and dry.  Superficial dermal avulsion of the left index finger as described in history of present illness. Function, motor and sensory is intact.  Nursing note and vitals reviewed.   ED Course  Procedures (including critical care  time)  Labs Review Labs Reviewed - No data to display  Imaging Review No results found.   Visual Acuity Review  Right Eye Distance:   Left Eye Distance:   Bilateral Distance:    Right Eye Near:   Left Eye Near:    Bilateral Near:         MDM   1. Avulsion of finger tip, initial encounter    Superficial skin avulsion to the left index finger distal phalanx pad. The wound was cleaned with jet spray of saline and mild soap. Covered with  Xeroform gauze and sterile dressing. Leave the dressing on for approximately 2 days. Keep the dressing on for 24-36 hours. Remove it slowly. A clean with mild soap and water daily May use a Band-Aid or other dressing to protect the wound. For any signs of infection recheck promptly.    Janne Napoleon, NP 03/01/15 [phone]

## 2015-03-01 NOTE — Discharge Instructions (Signed)
°  Wound Care Keep the dressing on for 24-36 hours. Remove it slowly. A clean with mild soap and water daily May use a Band-Aid or other dressing to protect the wound. For any signs of infection recheck promptly. Taking care of your wound properly can help to prevent pain and infection. It can also help your wound to heal more quickly.  HOW TO CARE FOR YOUR WOUND  Take or apply over-the-counter and prescription medicines only as told by your health care provider.  If you were prescribed antibiotic medicine, take or apply it as told by your health care provider. Do not stop using the antibiotic even if your condition improves.  Clean the wound each day or as told by your health care provider.  Wash the wound with mild soap and water.  Rinse the wound with water to remove all soap.  Pat the wound dry with a clean towel. Do not rub it.  There are many different ways to close and cover a wound. For example, a wound can be covered with stitches (sutures), skin glue, or adhesive strips. Follow instructions from your health care provider about:  How to take care of your wound.  When and how you should change your bandage (dressing).  When you should remove your dressing.  Removing whatever was used to close your wound.  Check your wound every day for signs of infection. Watch for:  Redness, swelling, or pain.  Fluid, blood, or pus.  Keep the dressing dry until your health care provider says it can be removed. Do not take baths, swim, use a hot tub, or do anything that would put your wound underwater until your health care provider approves.  Raise (elevate) the injured area above the level of your heart while you are sitting or lying down.  Do not scratch or pick at the wound.  Keep all follow-up visits as told by your health care provider. This is important. SEEK MEDICAL CARE IF:  You received a tetanus shot and you have swelling, severe pain, redness, or bleeding at the  injection site.  You have a fever.  Your pain is not controlled with medicine.  You have increased redness, swelling, or pain at the site of your wound.  You have fluid, blood, or pus coming from your wound.  You notice a bad smell coming from your wound or your dressing. SEEK IMMEDIATE MEDICAL CARE IF:  You have a red streak going away from your wound.   This information is not intended to replace advice given to you by your health care provider. Make sure you discuss any questions you have with your health care provider.   Document Released: 01/29/2008 Document Revised: 09/05/2014 Document Reviewed: 04/17/2014 Elsevier Interactive Patient Education [employer].

## 2015-11-22 ENCOUNTER — Telehealth: Payer: Self-pay | Admitting: Internal Medicine

## 2015-11-22 DIAGNOSIS — Z029 Encounter for administrative examinations, unspecified: Secondary | ICD-10-CM

## 2015-11-22 MED ORDER — PREDNISONE 10 MG PO TABS: ORAL_TABLET | ORAL | Status: DC

## 2015-11-22 NOTE — Telephone Encounter (Signed)
Patient called complaining of poison ivy on her face. When Sterapred DS 10 mg 6 day Dosepak 2 local pharmacy.

## 2015-11-22 NOTE — Telephone Encounter (Signed)
Called patient. Informed phone-in for predniSONE (DELTASONE) 10 MG tablet.  Patient was grateful.

## 2015-12-25 ENCOUNTER — Other Ambulatory Visit: Payer: Self-pay | Admitting: Obstetrics and Gynecology

## 2015-12-25 DIAGNOSIS — N63 Unspecified lump in unspecified breast: Secondary | ICD-10-CM

## 2015-12-28 ENCOUNTER — Ambulatory Visit
Admission: RE | Admit: 2015-12-28 | Discharge: 2015-12-28 | Disposition: A | Discharge status: Home / Self Care | Payer: BLUE CROSS/BLUE SHIELD | Source: Ambulatory Visit | Attending: Obstetrics and Gynecology | Admitting: Obstetrics and Gynecology

## 2015-12-28 ENCOUNTER — Other Ambulatory Visit: Payer: Self-pay | Admitting: Obstetrics and Gynecology

## 2015-12-28 ENCOUNTER — Ambulatory Visit
Admission: RE | Admit: 2015-12-28 | Discharge: 2015-12-28 | Disposition: A | Discharge status: Home / Self Care | Payer: No Typology Code available for payment source | Source: Ambulatory Visit | Attending: Obstetrics and Gynecology | Admitting: Obstetrics and Gynecology

## 2015-12-28 DIAGNOSIS — N63 Unspecified lump in unspecified breast: Secondary | ICD-10-CM

## 2016-03-31 LAB — TSH: TSH: 6.21

## 2016-04-20 IMAGING — CT CT RENAL STONE PROTOCOL
2 of 4 series · 12 of 46 positions shown, 14 images · non-contrast
Comparison: None.

CLINICAL DATA: Left lower quadrant pain is radiating to the flank

EXAM:
CT ABDOMEN AND PELVIS WITHOUT CONTRAST
TECHNIQUE: Multidetector CT imaging of the abdomen and pelvis was performed
following the standard protocol without IV contrast.

[Series 201: stone study, idose (2) · axial · 0.68mm/px · z∈[+45,+425]mm · 9 of 93 slices shown, 11 images]
[im 9/93  soft-tissue]
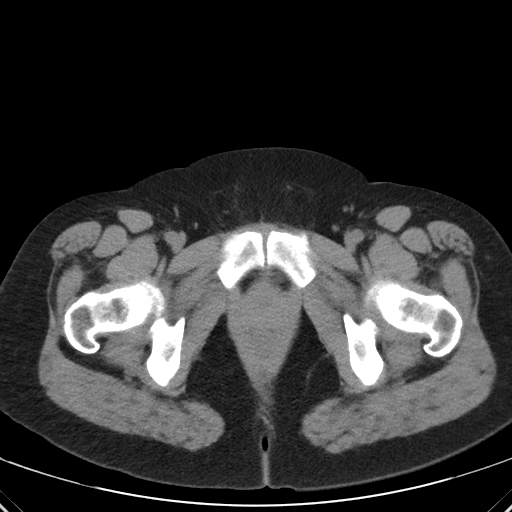
[im 9/93  bone]
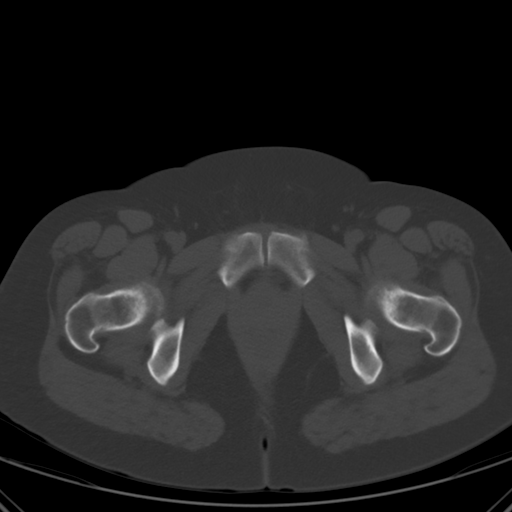
[im 17/93  soft-tissue]
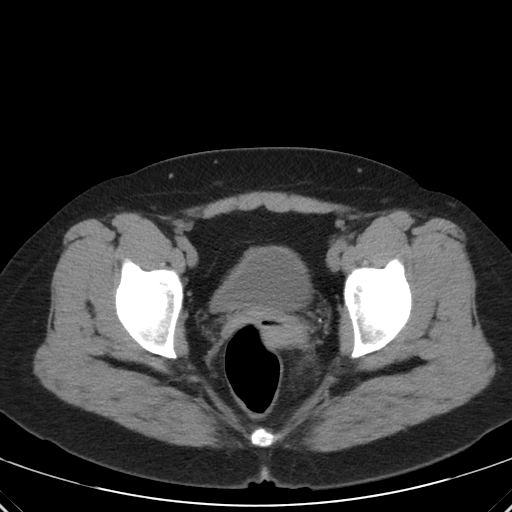
[im 29/93  soft-tissue]
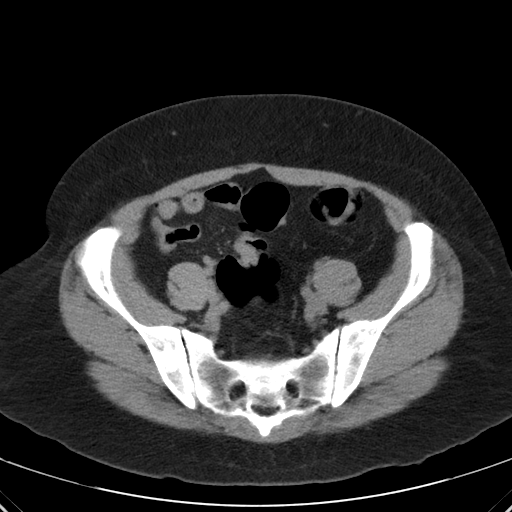
[im 37/93  soft-tissue]
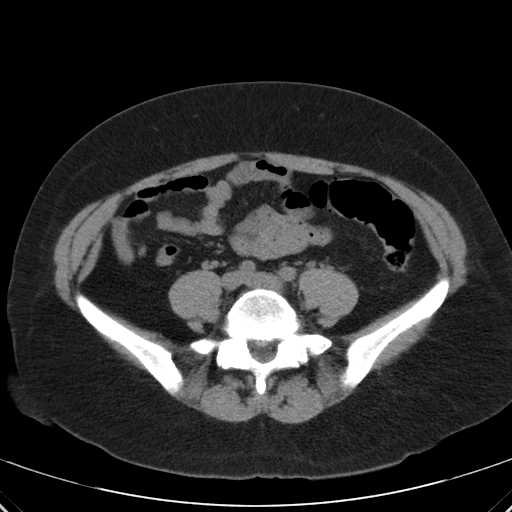
[im 49/93  soft-tissue]
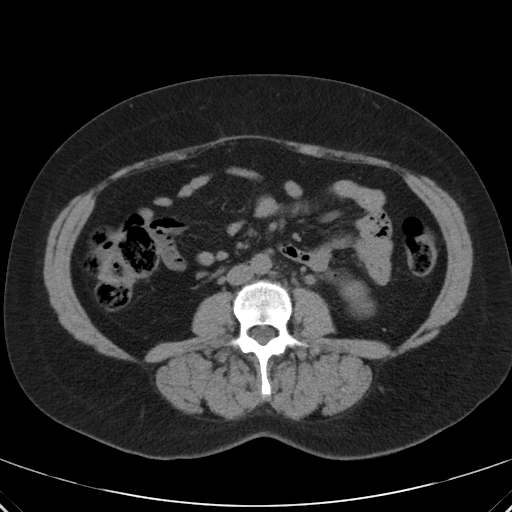
[im 57/93  soft-tissue]
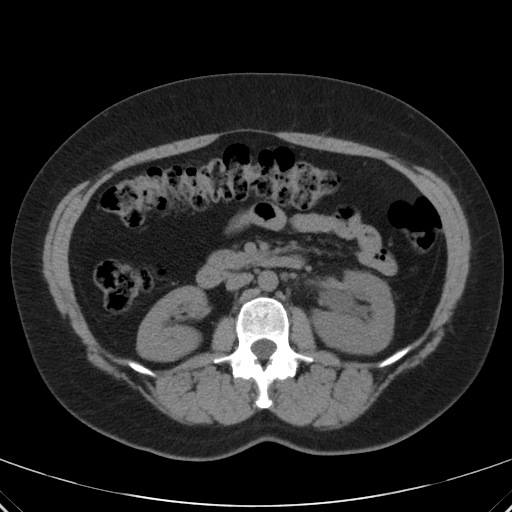
[im 65/93  soft-tissue]
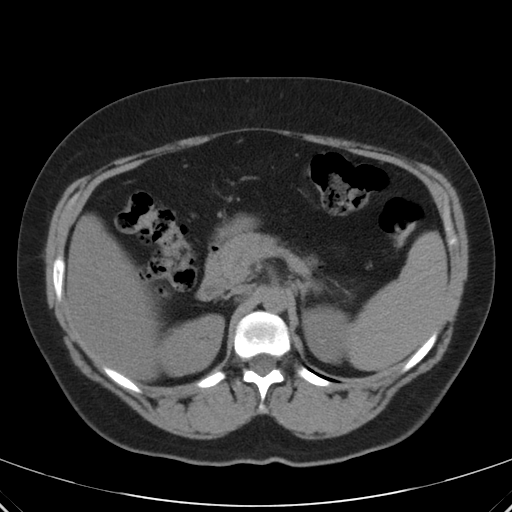
[im 77/93  soft-tissue]
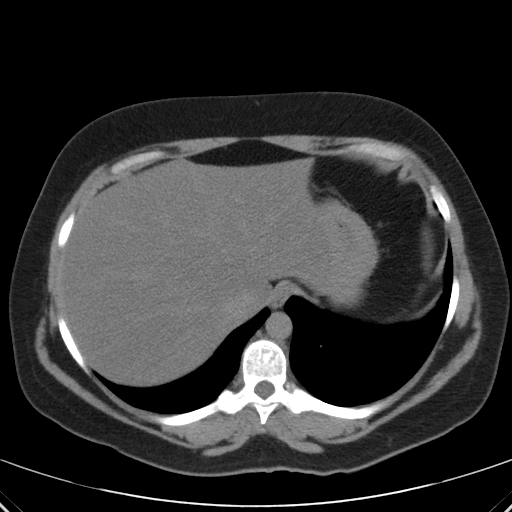
[im 85/93  soft-tissue]
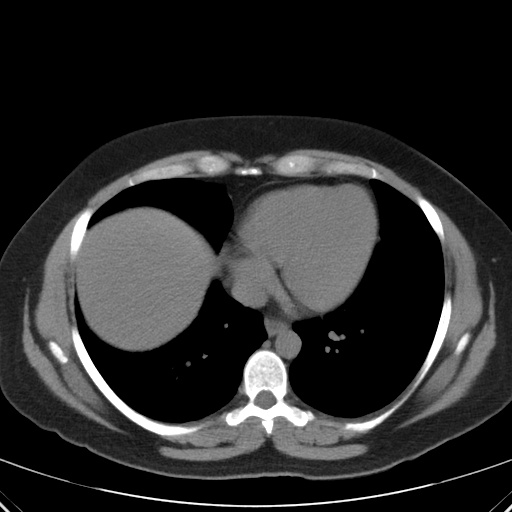
[im 85/93  bone]
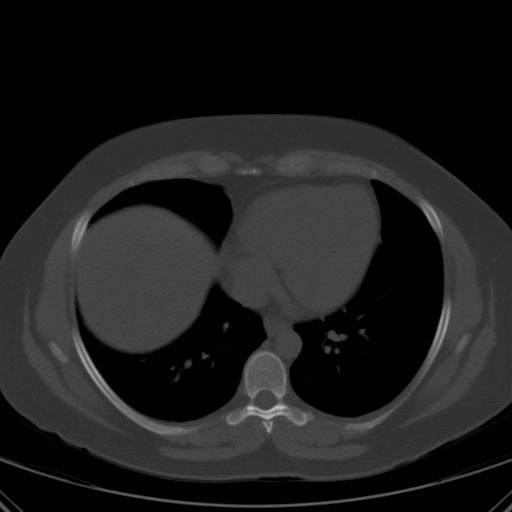

[Series 203: coronals, idose (2) · coronal · 0.45mm/px · 3 of 107 slices shown]
[im 36/107  soft-tissue]
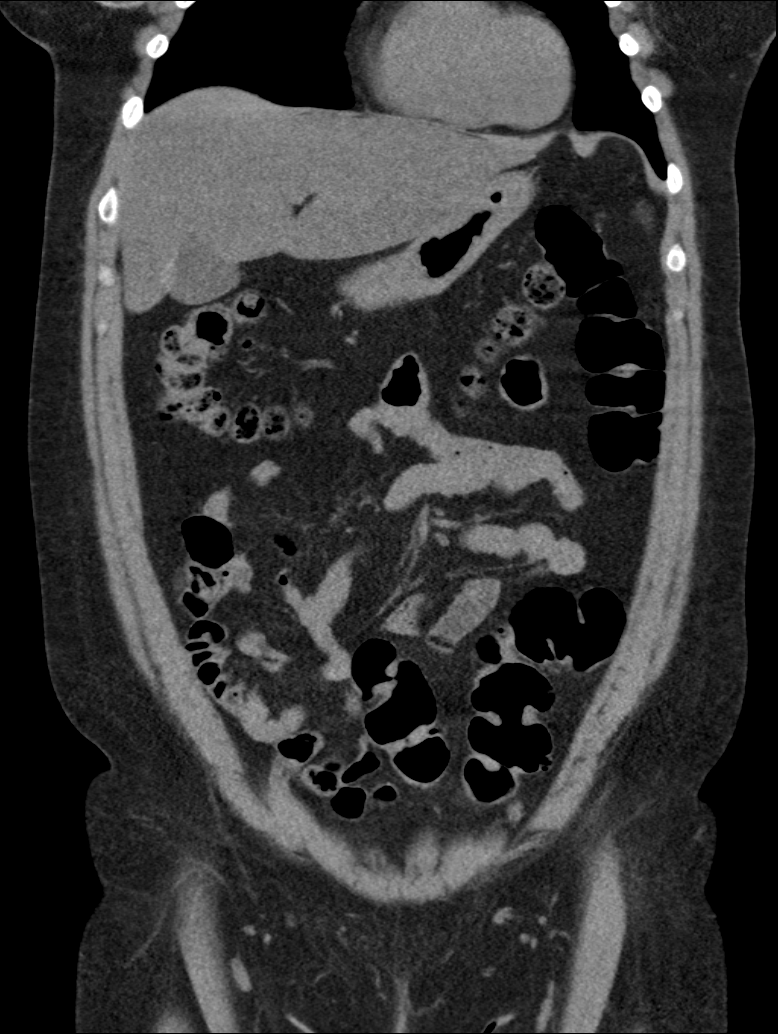
[im 48/107  soft-tissue]
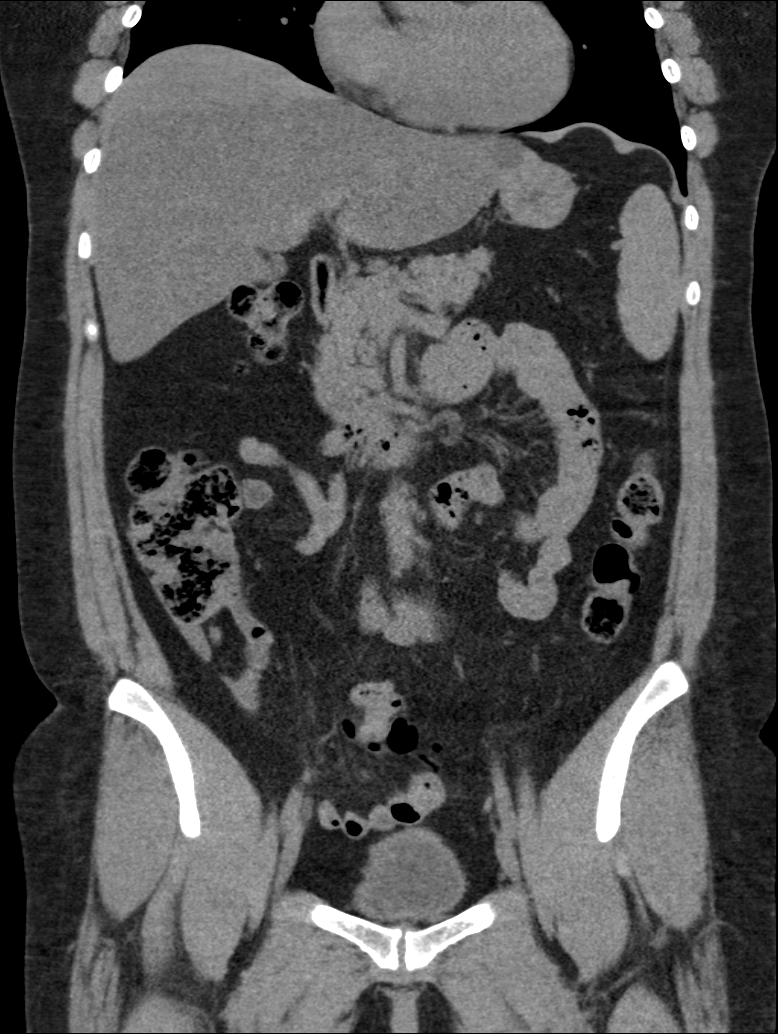
[im 59/107  soft-tissue]
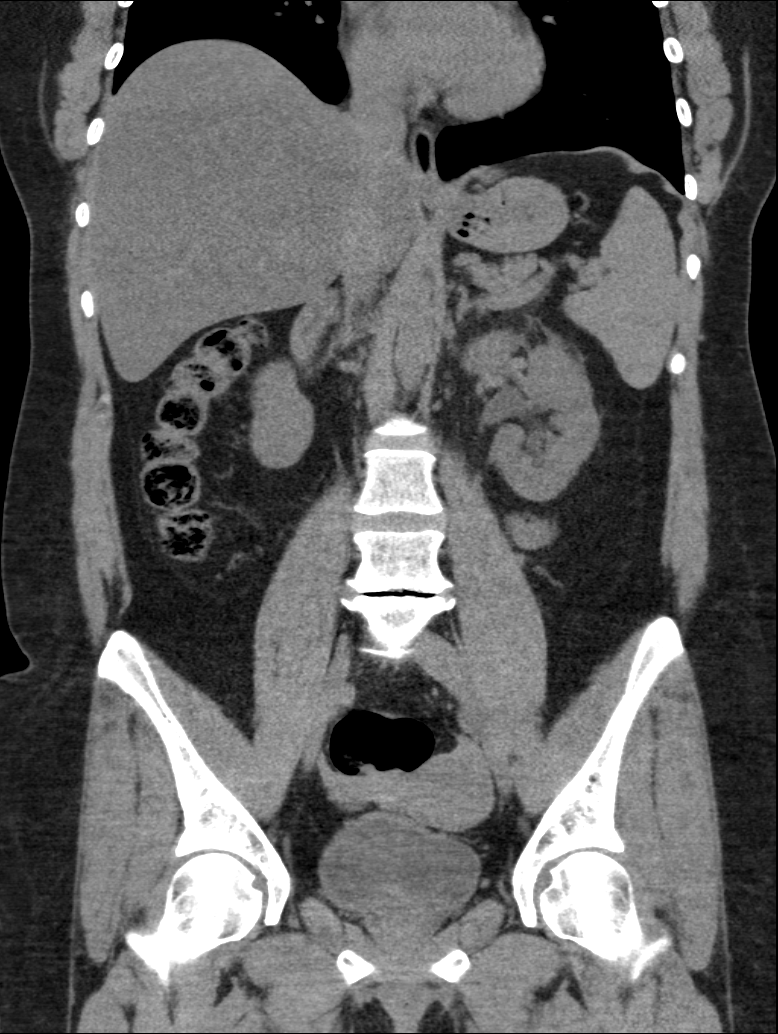

[12 of 46 positions shown; findings below may reference images not displayed]

FINDINGS: There is left hydro nephrosis due to obstruction by a 4 mm stone in
the proximal left ureter. There is no nephrolithiasis bilaterally.
There is no right hydronephrosis.

There is diffuse low density of the liver. There are low-density
lesions within the left lobe liver, largest measures 1.4 cm,
probably a cyst. The gallbladder, pancreas, spleen, adrenal glands
are normal. The aorta is normal. There is no abdominal
lymphadenopathy. There is no small bowel obstruction or
diverticulitis. The appendix is normal.

Partial fluid-filled bladder is normal. The uterus is normal. The
lung bases are clear. Degenerative joint changes of the spine are
noted.
IMPRESSION: Left hydronephrosis due to obstruction by a 4 mm stone in the
proximal left ureter.

## 2016-10-07 LAB — TSH: TSH: 1.07 (ref 0.41–5.90)

## 2017-12-03 NOTE — Progress Notes (Signed)
Subjective:    Patient ID: Jennifer Nicholson, female    DOB: 04/01/1971, 47 y.o.   MRN: [id number]  HPI:  Jennifer Nicholson is here to establish as a new pt.  She is a pleasant 47 year old female. PMH: Thyroid disease- currently on Levothyroxine 133mcg QD TSH, Free T4 drawn today She has been experiencing hot flashes since her mid 84s, LMP late 2017 She been on Norethindrone (Heather) 0.35mg  QD and Escitalopram 5mg  QD to control hormone imbalances s/s of menopause She estimates to drink >80 oz water/day and follows Heart Healthy Diet She has increased regular exercise- plays at least 5 tennis matches/week She has stopped drinking wine and only been enjoying <1.5 oz vodka nightly She denies tobacco use She reports losing >12 lbs in the last 4 weeks-great job! She reports lack of libido for several years   Patient Care Team    Relationship Specialty Notifications Start End  Mina Marble D, NP PCP - General Family Medicine  12/07/17   Jennifer Nicholson, [facility]    12/07/17     Patient Active Problem List   Diagnosis Date Noted  . Healthcare maintenance 12/07/2017  . BMI 30.0-30.9,adult 12/07/2017  . Graves' ophthalmopathy 04/18/2013  . Attention deficit 09/02/2011  . Hypothyroidism 07/31/2011     Past Medical History:  Diagnosis Date  . Chronic kidney disease    calculi  . Thyroid disease    hypothyroidism     History reviewed. No pertinent surgical history.   Family History  Problem Relation Age of Onset  . Thyroid disease Mother   . Thyroid disease Sister   . Thyroid disease Maternal Grandmother   . Thyroid disease Sister      Social History   Substance and Sexual Activity  Drug Use No     Social History   Substance and Sexual Activity  Alcohol Use Yes  . Alcohol/week: 2.4 oz  . Types: 4 Glasses of wine per week     Social History   Tobacco Use  Smoking Status Never Smoker  Smokeless Tobacco Never Used     Outpatient Encounter Medications  as of 12/07/2017  Medication Sig Note  . escitalopram (LEXAPRO) 5 MG tablet Take 1 tablet (5 mg total) by mouth daily.   Marland Kitchen levothyroxine (SYNTHROID, LEVOTHROID) 112 MCG tablet Take 112 mcg by mouth daily before breakfast.   . norethindrone (HEATHER) 0.35 MG tablet Take 1 tablet (0.35 mg total) by mouth daily.   . [DISCONTINUED] escitalopram (LEXAPRO) 5 MG tablet Take 1 tablet by mouth daily.   . [DISCONTINUED] hydrochlorothiazide (HYDRODIURIL) 25 MG tablet Take 25 mg by mouth daily. 01/30/2015: ...  . [DISCONTINUED] norethindrone (HEATHER) 0.35 MG tablet Take 1 tablet by mouth daily.   . [DISCONTINUED] ALPRAZolam (XANAX) 0.5 MG tablet TAKE 1 TABLET BY MOUTH TWICE A DAY AS NEEDED FOR ANXIETY.   . [DISCONTINUED] ciprofloxacin (CIPRO) 500 MG tablet Take 1 tablet (500 mg total) by mouth 2 (two) times daily.   . [DISCONTINUED] cyclobenzaprine (FLEXERIL) 10 MG tablet Take 10 mg by mouth daily as needed for muscle spasms.  01/30/2015: ...  . [DISCONTINUED] escitalopram (LEXAPRO) 20 MG tablet Take 20 mg by mouth daily. 01/30/2015: ...  . [DISCONTINUED] norethindrone (MICRONOR,CAMILA,ERRIN) 0.35 MG tablet Take 1 tablet by mouth daily.   . [DISCONTINUED] ondansetron (ZOFRAN) 4 MG tablet Take 1 tablet (4 mg total) by mouth every 8 (eight) hours as needed for nausea or vomiting.   . [DISCONTINUED] oxyCODONE-acetaminophen (PERCOCET/ROXICET) 5-325 MG tablet  Take 1 tablet by mouth every 6 (six) hours as needed for severe pain.   . [DISCONTINUED] predniSONE (DELTASONE) 10 MG tablet Take 6 tabs daily 1 and decrease by 1 tab daily 6-5-4-3-2-1 taper for poison ivy   . [DISCONTINUED] zolpidem (AMBIEN) 10 MG tablet Take 2.5-5 mg by mouth at bedtime as needed for sleep.  01/30/2015: ...   No facility-administered encounter medications on file as of 12/07/2017.     Allergies: Hydrocodone  Body mass index is 30.81 kg/m.  Blood pressure 120/78, pulse 77, height 5\' 3"  (1.6 m), weight 173 lb 14.4 oz (78.9 kg), last  menstrual period 03/01/2015, SpO2 99 %.  Review of Systems  Constitutional: Positive for fatigue. Negative for activity change, appetite change, chills, diaphoresis, fever and unexpected weight change.  Respiratory: Negative for cough, chest tightness, shortness of breath, wheezing and stridor.   Cardiovascular: Negative for chest pain, palpitations and leg swelling.  Gastrointestinal: Negative for abdominal distention, abdominal pain, blood in stool, constipation, diarrhea, nausea and vomiting.  Endocrine: Negative for cold intolerance, heat intolerance, polydipsia, polyphagia and polyuria.  Genitourinary: Negative for difficulty urinating and flank pain.  Musculoskeletal: Negative for arthralgias, back pain, gait problem, joint swelling, myalgias, neck pain and neck stiffness.  Skin: Negative for color change, pallor, rash and wound.  Neurological: Positive for headaches. Negative for dizziness.  Hematological: Does not bruise/bleed easily.  Psychiatric/Behavioral: Negative for decreased concentration, dysphoric mood, hallucinations, self-injury, sleep disturbance and suicidal ideas. The patient is not nervous/anxious and is not hyperactive.        Objective:   Physical Exam  Constitutional: She is oriented to person, place, and time. She appears well-developed and well-nourished. No distress.  HENT:  Head: Normocephalic and atraumatic.  Right Ear: External ear normal.  Left Ear: External ear normal.  Nose: Nose normal.  Mouth/Throat: Oropharynx is clear and moist.  Eyes: Pupils are equal, round, and reactive to light. Conjunctivae and EOM are normal.  Cardiovascular: Normal rate, regular rhythm, normal heart sounds and intact distal pulses.  No murmur heard. Pulmonary/Chest: Effort normal and breath sounds normal. No stridor. No respiratory distress. She has no wheezes. She has no rales. She exhibits no tenderness.  Neurological: She is alert and oriented to person, place, and time.   Skin: Skin is warm and dry. Capillary refill takes less than 2 seconds. No rash noted. She is not diaphoretic. No erythema.  Psychiatric: She has a normal mood and affect. Her behavior is normal. Judgment and thought content normal.  Nursing note and vitals reviewed.     Assessment & Plan:   1. Hypothyroidism, unspecified type   2. Healthcare maintenance   3. BMI 30.0-30.9,adult     Healthcare maintenance Continue all medications as directed. Once thyroid panel has resulted, will fill Levothyroxine rx. Continue to drink plenty of water and follow Mediterranean diet. Continue regular exercise- GRE[employer] JOB on the weight loss. To improved libido, try to initiate intimate contact at least once/week. Please follow-up in 2-3 months for complete physical with fasting labs.  Hypothyroidism TSH, Free T4 drawn today Currently on Levothyroxine 130mcg QD  BMI 30.0-30.9,adult Body mass index is 30.81 kg/m.  Current wt 173 Pt reports >12 lbs wt loss in last 4 weeks    FOLLOW-UP:  Return in about 3 months (around 03/09/2018) for Fasting Labs, CPE.

## 2017-12-07 ENCOUNTER — Encounter: Payer: Self-pay | Admitting: Adult Health

## 2017-12-07 ENCOUNTER — Ambulatory Visit (INDEPENDENT_AMBULATORY_CARE_PROVIDER_SITE_OTHER): Payer: BLUE CROSS/BLUE SHIELD | Admitting: Adult Health

## 2017-12-07 VITALS — BP 120/78 | HR 77 | Ht 63.0 in | Wt 173.9 lb

## 2017-12-07 DIAGNOSIS — Z0000 Encounter for general adult medical examination without abnormal findings: Secondary | ICD-10-CM

## 2017-12-07 DIAGNOSIS — Z6830 Body mass index (BMI) 30.0-30.9, adult: Secondary | ICD-10-CM | POA: Diagnosis not present

## 2017-12-07 DIAGNOSIS — E039 Hypothyroidism, unspecified: Secondary | ICD-10-CM

## 2017-12-07 MED ORDER — NORETHINDRONE 0.35 MG PO TABS: 1.0000 | ORAL_TABLET | Freq: Every day | ORAL | 11 refills | Status: DC

## 2017-12-07 MED ORDER — ESCITALOPRAM OXALATE 5 MG PO TABS: 5.0000 mg | ORAL_TABLET | Freq: Every day | ORAL | 3 refills | Status: DC

## 2017-12-07 NOTE — Patient Instructions (Signed)
Mediterranean Diet A Mediterranean diet refers to food and lifestyle choices that are based on the traditions of countries located on the [place]. This way of eating has been shown to help prevent certain conditions and improve outcomes for people who have chronic diseases, like kidney disease and heart disease. What are tips for following this plan? Lifestyle  Cook and eat meals together with your family, when possible.  Drink enough fluid to keep your urine clear or pale yellow.  Be physically active every day. This includes: ? Aerobic exercise like running or swimming. ? Leisure activities like gardening, walking, or housework.  Get 7-8 hours of sleep each night.  If recommended by your health care provider, drink red wine in moderation. This means 1 glass a day for nonpregnant women and 2 glasses a day for men. A glass of wine equals 5 oz (150 mL). Reading food labels  Check the serving size of packaged foods. For foods such as rice and pasta, the serving size refers to the amount of cooked product, not dry.  Check the total fat in packaged foods. Avoid foods that have saturated fat or trans fats.  Check the ingredients list for added sugars, such as corn syrup. Shopping  At the grocery store, buy most of your food from the areas near the walls of the store. This includes: ? Fresh fruits and vegetables (produce). ? Grains, beans, nuts, and seeds. Some of these may be available in unpackaged forms or large amounts (in bulk). ? Fresh seafood. ? Poultry and eggs. ? Low-fat dairy products.  Buy whole ingredients instead of prepackaged foods.  Buy fresh fruits and vegetables in-season from local farmers markets.  Buy frozen fruits and vegetables in resealable bags.  If you do not have access to quality fresh seafood, buy precooked frozen shrimp or canned fish, such as tuna, salmon, or sardines.  Buy small amounts of raw or cooked vegetables, salads, or olives from the  deli or salad bar at your store.  Stock your pantry so you always have certain foods on hand, such as olive oil, canned tuna, canned tomatoes, rice, pasta, and beans. Cooking  Cook foods with extra-virgin olive oil instead of using butter or other vegetable oils.  Have meat as a side dish, and have vegetables or grains as your main dish. This means having meat in small portions or adding small amounts of meat to foods like pasta or stew.  Use beans or vegetables instead of meat in common dishes like chili or lasagna.  Experiment with different cooking methods. Try roasting or broiling vegetables instead of steaming or sauteing them.  Add frozen vegetables to soups, stews, pasta, or rice.  Add nuts or seeds for added healthy fat at each meal. You can add these to yogurt, salads, or vegetable dishes.  Marinate fish or vegetables using olive oil, lemon juice, garlic, and fresh herbs. Meal planning  Plan to eat 1 vegetarian meal one day each week. Try to work up to 2 vegetarian meals, if possible.  Eat seafood 2 or more times a week.  Have healthy snacks readily available, such as: ? Vegetable sticks with hummus. ? Greek yogurt. ? Fruit and nut trail mix.  Eat balanced meals throughout the week. This includes: ? Fruit: 2-3 servings a day ? Vegetables: 4-5 servings a day ? Low-fat dairy: 2 servings a day ? Fish, poultry, or lean meat: 1 serving a day ? Beans and legumes: 2 or more servings a week ? Nuts   and seeds: 1-2 servings a day ? Whole grains: 6-8 servings a day ? Extra-virgin olive oil: 3-4 servings a day  Limit red meat and sweets to only a few servings a month What are my food choices?  Mediterranean diet ? Recommended ? Grains: Whole-grain pasta. Brown rice. Bulgar wheat. Polenta. Couscous. Whole-wheat bread. [place]. ? Vegetables: Artichokes. Beets. Broccoli. Cabbage. Carrots. Eggplant. Green beans. Chard. Kale. Spinach. Onions. Leeks. Peas. Squash.  Tomatoes. Peppers. Radishes. ? Fruits: Apples. Apricots. Avocado. Berries. Bananas. Cherries. Dates. Figs. Grapes. Lemons. Melon. Oranges. Peaches. Plums. Pomegranate. ? Meats and other protein foods: Beans. Almonds. Sunflower seeds. Pine nuts. Peanuts. [place]. Salmon. Scallops. Shrimp. [place]. Tilapia. Clams. Oysters. Eggs. ? Dairy: Low-fat milk. Cheese. Greek yogurt. ? Beverages: Water. Red wine. Herbal tea. ? Fats and oils: Extra virgin olive oil. Avocado oil. Grape seed oil. ? Sweets and desserts: [place] yogurt with honey. Baked apples. Poached pears. Trail mix. ? Seasoning and other foods: Basil. Cilantro. Coriander. Cumin. Mint. Parsley. Sage. Rosemary. Tarragon. Garlic. Oregano. Thyme. Pepper. Balsalmic vinegar. Tahini. Hummus. Tomato sauce. Olives. Mushrooms. ? Limit these ? Grains: Prepackaged pasta or rice dishes. Prepackaged cereal with added sugar. ? Vegetables: Deep fried potatoes (french fries). ? Fruits: Fruit canned in syrup. ? Meats and other protein foods: Beef. Pork. Lamb. Poultry with skin. Hot dogs. Berniece Salines. ? Dairy: Ice cream. Sour cream. Whole milk. ? Beverages: Juice. Sugar-sweetened soft drinks. Beer. Liquor and spirits. ? Fats and oils: Butter. Canola oil. Vegetable oil. Beef fat (tallow). Lard. ? Sweets and desserts: Cookies. Cakes. Pies. Candy. ? Seasoning and other foods: Mayonnaise. Premade sauces and marinades. ? The items listed may not be a complete list. Talk with your dietitian about what dietary choices are right for you. Summary  The Mediterranean diet includes both food and lifestyle choices.  Eat a variety of fresh fruits and vegetables, beans, nuts, seeds, and whole grains.  Limit the amount of red meat and sweets that you eat.  Talk with your health care provider about whether it is safe for you to drink red wine in moderation. This means 1 glass a day for nonpregnant women and 2 glasses a day for men. A glass of wine equals 5 oz (150 mL). This information  is not intended to replace advice given to you by your health care provider. Make sure you discuss any questions you have with your health care provider. Document Released: 12/13/2015 Document Revised: 01/15/2016 Document Reviewed: 12/13/2015 Elsevier Interactive Patient Education  2018 [place] all medications as directed. Once thyroid panel has resulted, will fill Levothyroxine rx. Continue to drink plenty of water and follow Mediterranean diet. Continue regular exercise- GRE[employer] JOB on the weight loss. To improved libido, try to initiate intimate contact at least once/week. Please follow-up in 2-3 months for complete physical with fasting labs. WELCOME TO THE PRACTICE!

## 2017-12-07 NOTE — Assessment & Plan Note (Signed)
TSH, Free T4 drawn today Currently on Levothyroxine 169mcg QD

## 2017-12-07 NOTE — Assessment & Plan Note (Signed)
Body mass index is 30.81 kg/m.  Current wt 173 Pt reports >12 lbs wt loss in last 4 weeks

## 2017-12-07 NOTE — Assessment & Plan Note (Signed)
Continue all medications as directed. Once thyroid panel has resulted, will fill Levothyroxine rx. Continue to drink plenty of water and follow Mediterranean diet. Continue regular exercise- [employer] J[employer] on the weight loss. To improved libido, try to initiate intimate contact at least once/week. Please follow-up in 2-3 months for complete physical with fasting labs.

## 2017-12-08 ENCOUNTER — Other Ambulatory Visit: Payer: Self-pay | Admitting: Adult Health

## 2017-12-08 LAB — T4, FREE: Free T4: 1.38 ng/dL (ref 0.82–1.77)

## 2017-12-08 LAB — TSH: TSH: 3.26 u[IU]/mL (ref 0.450–4.500)

## 2017-12-08 MED ORDER — LEVOTHYROXINE SODIUM 112 MCG PO TABS: 112.0000 ug | ORAL_TABLET | Freq: Every day | ORAL | 2 refills | Status: DC

## 2018-03-08 ENCOUNTER — Other Ambulatory Visit: Payer: BLUE CROSS/BLUE SHIELD

## 2018-03-11 NOTE — Progress Notes (Signed)
Subjective:    Patient ID: Jennifer Nicholson, female    DOB: [birth year], 47 y.o.   MRN: [id number]  HPI: 12/07/17 OV: Jennifer Nicholson is here to establish as a new pt.  She is a pleasant 47 year old female. PMH: Thyroid disease- currently on Levothyroxine 169mcg QD TSH, Free T4 drawn today She has been experiencing hot flashes since her mid 61s, LMP late 2017 She been on Norethindrone (Heather) 0.35mg  QD and Escitalopram 5mg  QD to control hormone imbalances s/s of menopause She estimates to drink >80 oz water/day and follows Heart Healthy Diet She has increased regular exercise- plays at least 5 tennis matches/week She has stopped drinking wine and only been enjoying <1.5 oz vodka nightly She denies tobacco use She reports losing >12 lbs in the last 4 weeks-great job! She reports lack of libido for several years  03/15/18 OV: Jennifer Nicholson is here for CPE She reports medication compliance, denies SE She continues to drink plenty of water and follows a heart healthy diet Due to weather is only able to play tennis once/week, but has increased gym time- cardio (treadmill) and weight training She is non-fasting today, she will schedule lab appt ASAP  Healthcare Maintenance: PAP-updated today Mammogram-ordered  Immunizations-UTD  Patient Care Team    Relationship Specialty Notifications Start End  Esaw Grandchild, NP PCP - General Family Medicine  12/07/17   Lady Gary, [facility]    12/07/17     Patient Active Problem List   Diagnosis Date Noted  . Healthcare maintenance 12/07/2017  . BMI 30.0-30.9,adult 12/07/2017  . Graves' ophthalmopathy 04/18/2013  . Attention deficit 09/02/2011  . Hypothyroidism 07/31/2011     Past Medical History:  Diagnosis Date  . Chronic kidney disease    calculi  . Thyroid disease    hypothyroidism     No past surgical history on file.   Family History  Problem Relation Age of Onset  . Thyroid disease Mother   . Thyroid disease Sister    . Thyroid disease Maternal Grandmother   . Thyroid disease Sister      Social History   Substance and Sexual Activity  Drug Use No     Social History   Substance and Sexual Activity  Alcohol Use Yes  . Alcohol/week: 4.0 standard drinks  . Types: 4 Glasses of wine per week     Social History   Tobacco Use  Smoking Status Never Smoker  Smokeless Tobacco Never Used     Outpatient Encounter Medications as of 03/15/2018  Medication Sig  . escitalopram (LEXAPRO) 5 MG tablet Take 1 tablet (5 mg total) by mouth daily.  Marland Kitchen levothyroxine (SYNTHROID, LEVOTHROID) 112 MCG tablet Take 1 tablet (112 mcg total) by mouth daily before breakfast.  . norethindrone (HEATHER) 0.35 MG tablet Take 1 tablet (0.35 mg total) by mouth daily.   No facility-administered encounter medications on file as of 03/15/2018.     Allergies: Hydrocodone  Body mass index is 31.68 kg/m.  Blood pressure 117/86, pulse 78, temperature 98.4 F (36.9 C), temperature source Oral, height 5' 2.5" (1.588 m), weight 176 lb (79.8 kg), last menstrual period 03/01/2015, SpO2 97 %.  Review of Systems  Constitutional: Positive for fatigue. Negative for activity change, appetite change, chills, diaphoresis, fever and unexpected weight change.  HENT: Negative for congestion.   Eyes: Negative for visual disturbance.  Respiratory: Negative for cough, chest tightness, shortness of breath, wheezing and stridor.   Cardiovascular: Negative for chest pain, palpitations  and leg swelling.  Gastrointestinal: Negative for abdominal distention, abdominal pain, blood in stool, constipation, diarrhea, nausea and vomiting.  Endocrine: Negative for cold intolerance, heat intolerance, polydipsia, polyphagia and polyuria.  Genitourinary: Negative for difficulty urinating and flank pain.  Musculoskeletal: Negative for arthralgias, back pain, gait problem, joint swelling, myalgias, neck pain and neck stiffness.  Skin: Negative for  color change, pallor, rash and wound.  Neurological: Negative for dizziness and headaches.  Hematological: Does not bruise/bleed easily.  Psychiatric/Behavioral: Negative for decreased concentration, dysphoric mood, hallucinations, self-injury, sleep disturbance and suicidal ideas. The patient is not nervous/anxious and is not hyperactive.        Objective:   Physical Exam  Constitutional: She is oriented to person, place, and time. She appears well-developed and well-nourished. No distress.  HENT:  Head: Normocephalic and atraumatic.  Right Ear: External ear normal. Tympanic membrane is not erythematous and not bulging. No decreased hearing is noted.  Left Ear: External ear normal. Tympanic membrane is not erythematous and not bulging. No decreased hearing is noted.  Nose: No mucosal edema or rhinorrhea. Right sinus exhibits no maxillary sinus tenderness and no frontal sinus tenderness. Left sinus exhibits no maxillary sinus tenderness and no frontal sinus tenderness.  Mouth/Throat: Oropharynx is clear and moist and mucous membranes are normal. Normal dentition. Tonsils are 0 on the right. Tonsils are 0 on the left. No tonsillar exudate.  Eyes: Pupils are equal, round, and reactive to light. Conjunctivae and EOM are normal.  Neck: Normal range of motion. Neck supple.  Cardiovascular: Normal rate, regular rhythm, normal heart sounds and intact distal pulses.  No murmur heard. Pulmonary/Chest: Effort normal. No stridor. No respiratory distress. She has no decreased breath sounds. She has no wheezes. She has no rhonchi. She has no rales. She exhibits no tenderness. Right breast exhibits no inverted nipple, no mass, no nipple discharge, no skin change and no tenderness. Left breast exhibits no inverted nipple, no mass, no nipple discharge, no skin change and no tenderness.  Well healed bil breast reduction scars  Abdominal: Soft. Bowel sounds are normal. She exhibits no distension and no mass.  There is no tenderness. There is no rebound and no guarding. No hernia.  Genitourinary: Pelvic exam was performed with patient supine. No vaginal discharge found.  Genitourinary Comments: Chaperone present during examination.  Musculoskeletal: She exhibits tenderness.       Cervical back: She exhibits tenderness.  Lymphadenopathy:    She has no cervical adenopathy.  Neurological: She is alert and oriented to person, place, and time. Coordination normal.  Skin: Skin is warm and dry. Capillary refill takes less than 2 seconds. No rash noted. She is not diaphoretic. No erythema.  Psychiatric: She has a normal mood and affect. Her behavior is normal. Judgment and thought content normal.  Nursing note and vitals reviewed.     Assessment & Plan:   1. Need for Tdap vaccination   2. Screening for breast cancer   3. Atypical mole   4. Screening for cervical cancer   5. Screening for HPV (human papillomavirus)   6. Healthcare maintenance     Healthcare maintenance Continue all medications as directed. Continue to drink plenty of water and follow Mediterranean diet. Continue regular exercise. Dermatology referral placed. Please schedule fasting lab appt at your convenience. If your large breasts continue to cause neck/back pain despite continued weight loss in 6 months, call clinic and plastic referral will be placed. We will call you with PAP results. [facility] will [facility] you to schedule Mammogram appt.. Overall you are doing a great job taking care of yourself!    FOLLOW-UP:  Return in about 1 year (around 03/16/2019) for CPE, Fasting Labs.

## 2018-03-15 ENCOUNTER — Encounter: Payer: Self-pay | Admitting: Adult Health

## 2018-03-15 ENCOUNTER — Ambulatory Visit (INDEPENDENT_AMBULATORY_CARE_PROVIDER_SITE_OTHER): Payer: BLUE CROSS/BLUE SHIELD | Admitting: Adult Health

## 2018-03-15 ENCOUNTER — Other Ambulatory Visit (HOSPITAL_COMMUNITY)
Admission: RE | Admit: 2018-03-15 | Discharge: 2018-03-15 | Disposition: A | Discharge status: Home / Self Care | Payer: BLUE CROSS/BLUE SHIELD | Source: Ambulatory Visit | Attending: Adult Health | Admitting: Adult Health

## 2018-03-15 VITALS — BP 117/86 | HR 78 | Temp 98.4°F | Ht 62.5 in | Wt 176.0 lb

## 2018-03-15 DIAGNOSIS — Z1239 Encounter for other screening for malignant neoplasm of breast: Secondary | ICD-10-CM | POA: Diagnosis not present

## 2018-03-15 DIAGNOSIS — Z124 Encounter for screening for malignant neoplasm of cervix: Secondary | ICD-10-CM | POA: Insufficient documentation

## 2018-03-15 DIAGNOSIS — Z1151 Encounter for screening for human papillomavirus (HPV): Secondary | ICD-10-CM

## 2018-03-15 DIAGNOSIS — D229 Melanocytic nevi, unspecified: Secondary | ICD-10-CM | POA: Diagnosis not present

## 2018-03-15 DIAGNOSIS — Z23 Encounter for immunization: Secondary | ICD-10-CM | POA: Diagnosis not present

## 2018-03-15 DIAGNOSIS — Z0000 Encounter for general adult medical examination without abnormal findings: Secondary | ICD-10-CM | POA: Diagnosis not present

## 2018-03-15 NOTE — Patient Instructions (Addendum)
Preventive Care for Adults, Female  A healthy lifestyle and preventive care can promote health and wellness. Preventive health guidelines for women include the following key practices.   A routine yearly physical is a good way to check with your health care provider about your health and preventive screening. It is a chance to share any concerns and updates on your health and to receive a thorough exam.   Visit your dentist for a routine exam and preventive care every 6 months. Brush your teeth twice a day and floss once a day. Good oral hygiene prevents tooth decay and gum disease.   The frequency of eye exams is based on your age, health, family medical history, use of contact lenses, and other factors. Follow your health care provider's recommendations for frequency of eye exams.   Eat a healthy diet. Foods like vegetables, fruits, whole grains, low-fat dairy products, and lean protein foods contain the nutrients you need without too many calories. Decrease your intake of foods high in solid fats, added sugars, and salt. Eat the right amount of calories for you.Get information about a proper diet from your health care provider, if necessary.   Regular physical exercise is one of the most important things you can do for your health. Most adults should get at least 150 minutes of moderate-intensity exercise (any activity that increases your heart rate and causes you to sweat) each week. In addition, most adults need muscle-strengthening exercises on 2 or more days a week.   Maintain a healthy weight. The body mass index (BMI) is a screening tool to identify possible weight problems. It provides an estimate of body fat based on height and weight. Your health care provider can find your BMI, and can help you achieve or maintain a healthy weight.For adults 20 years and older:   - A BMI below 18.5 is considered underweight.   - A BMI of 18.5 to 24.9 is normal.   - A BMI of 25 to 29.9 is  considered overweight.   - A BMI of 30 and above is considered obese.   Maintain normal blood lipids and cholesterol levels by exercising and minimizing your intake of trans and saturated fats.  Eat a balanced diet with plenty of fruit and vegetables. Blood tests for lipids and cholesterol should begin at age 20 and be repeated every 5 years minimum.  If your lipid or cholesterol levels are high, you are over 40, or you are at high risk for heart disease, you may need your cholesterol levels checked more frequently.Ongoing high lipid and cholesterol levels should be treated with medicines if diet and exercise are not working.   If you smoke, find out from your health care provider how to quit. If you do not use tobacco, do not start.   Lung cancer screening is recommended for adults aged 55-80 years who are at high risk for developing lung cancer because of a history of smoking. A yearly low-dose CT scan of the lungs is recommended for people who have at least a 30-pack-year history of smoking and are a current smoker or have quit within the past 15 years. A pack year of smoking is smoking an average of 1 pack of cigarettes a day for 1 year (for example: 1 pack a day for 30 years or 2 packs a day for 15 years). Yearly screening should continue until the smoker has stopped smoking for at least 15 years. Yearly screening should be stopped for people who develop a   health problem that would prevent them from having lung cancer treatment.   If you are pregnant, do not drink alcohol. If you are breastfeeding, be very cautious about drinking alcohol. If you are not pregnant and choose to drink alcohol, do not have more than 1 drink per day. One drink is considered to be 12 ounces (355 mL) of beer, 5 ounces (148 mL) of wine, or 1.5 ounces (44 mL) of liquor.   Avoid use of street drugs. Do not share needles with anyone. Ask for help if you need support or instructions about stopping the use of  drugs.   High blood pressure causes heart disease and increases the risk of stroke. Your blood pressure should be checked at least yearly.  Ongoing high blood pressure should be treated with medicines if weight loss and exercise do not work.   If you are 69-55 years old, ask your health care provider if you should take aspirin to prevent strokes.   Diabetes screening involves taking a blood sample to check your fasting blood sugar level. This should be done once every 3 years, after age 38, if you are within normal weight and without risk factors for diabetes. Testing should be considered at a younger age or be carried out more frequently if you are overweight and have at least 1 risk factor for diabetes.   Breast cancer screening is essential preventive care for women. You should practice "breast self-awareness."  This means understanding the normal appearance and feel of your breasts and may include breast self-examination.  Any changes detected, no matter how small, should be reported to a health care provider.  Women in their 80s and 30s should have a clinical breast exam (CBE) by a health care provider as part of a regular health exam every 1 to 3 years.  After age 66, women should have a CBE every year.  Starting at age 1, women should consider having a mammogram (breast X-ray test) every year.  Women who have a family history of breast cancer should talk to their health care provider about genetic screening.  Women at a high risk of breast cancer should talk to their health care providers about having an MRI and a mammogram every year.   -Breast cancer gene (BRCA)-related cancer risk assessment is recommended for women who have family members with BRCA-related cancers. BRCA-related cancers include breast, ovarian, tubal, and peritoneal cancers. Having family members with these cancers may be associated with an increased risk for harmful changes (mutations) in the breast cancer genes BRCA1 and  BRCA2. Results of the assessment will determine the need for genetic counseling and BRCA1 and BRCA2 testing.   The Pap test is a screening test for cervical cancer. A Pap test can show cell changes on the cervix that might become cervical cancer if left untreated. A Pap test is a procedure in which cells are obtained and examined from the lower end of the uterus (cervix).   - Women should have a Pap test starting at age 57.   - Between ages 90 and 70, Pap tests should be repeated every 2 years.   - Beginning at age 63, you should have a Pap test every 3 years as long as the past 3 Pap tests have been normal.   - Some women have medical problems that increase the chance of getting cervical cancer. Talk to your health care provider about these problems. It is especially important to talk to your health care provider if a  new problem develops soon after your last Pap test. In these cases, your health care provider may recommend more frequent screening and Pap tests.   - The above recommendations are the same for women who have or have not gotten the vaccine for human papillomavirus (HPV).   - If you had a hysterectomy for a problem that was not cancer or a condition that could lead to cancer, then you no longer need Pap tests. Even if you no longer need a Pap test, a regular exam is a good idea to make sure no other problems are starting.   - If you are between ages 36 and 66 years, and you have had normal Pap tests going back 10 years, you no longer need Pap tests. Even if you no longer need a Pap test, a regular exam is a good idea to make sure no other problems are starting.   - If you have had past treatment for cervical cancer or a condition that could lead to cancer, you need Pap tests and screening for cancer for at least 20 years after your treatment.   - If Pap tests have been discontinued, risk factors (such as a new sexual partner) need to be reassessed to determine if screening should  be resumed.   - The HPV test is an additional test that may be used for cervical cancer screening. The HPV test looks for the virus that can cause the cell changes on the cervix. The cells collected during the Pap test can be tested for HPV. The HPV test could be used to screen women aged 70 years and older, and should be used in women of any age who have unclear Pap test results. After the age of 67, women should have HPV testing at the same frequency as a Pap test.   Colorectal cancer can be detected and often prevented. Most routine colorectal cancer screening begins at the age of 57 years and continues through age 26 years. However, your health care provider may recommend screening at an earlier age if you have risk factors for colon cancer. On a yearly basis, your health care provider may provide home test kits to check for hidden blood in the stool.  Use of a small camera at the end of a tube, to directly examine the colon (sigmoidoscopy or colonoscopy), can detect the earliest forms of colorectal cancer. Talk to your health care provider about this at age 23, when routine screening begins. Direct exam of the colon should be repeated every 5 -10 years through age 49 years, unless early forms of pre-cancerous polyps or small growths are found.   People who are at an increased risk for hepatitis B should be screened for this virus. You are considered at high risk for hepatitis B if:  -You were born in a country where hepatitis B occurs often. Talk with your health care provider about which countries are considered high risk.  - Your parents were born in a high-risk country and you have not received a shot to protect against hepatitis B (hepatitis B vaccine).  - You have HIV or AIDS.  - You use needles to inject street drugs.  - You live with, or have sex with, someone who has Hepatitis B.  - You get hemodialysis treatment.  - You take certain medicines for conditions like cancer, organ  transplantation, and autoimmune conditions.   Hepatitis C blood testing is recommended for all people born from [birth year] through 1965 and any individual  with known risks for hepatitis C.   Practice safe sex. Use condoms and avoid high-risk sexual practices to reduce the spread of sexually transmitted infections (STIs). STIs include gonorrhea, chlamydia, syphilis, trichomonas, herpes, HPV, and human immunodeficiency virus (HIV). Herpes, HIV, and HPV are viral illnesses that have no cure. They can result in disability, cancer, and death. Sexually active women aged 25 years and younger should be checked for chlamydia. Older women with new or multiple partners should also be tested for chlamydia. Testing for other STIs is recommended if you are sexually active and at increased risk.   Osteoporosis is a disease in which the bones lose minerals and strength with aging. This can result in serious bone fractures or breaks. The risk of osteoporosis can be identified using a bone density scan. Women ages 65 years and over and women at risk for fractures or osteoporosis should discuss screening with their health care providers. Ask your health care provider whether you should take a calcium supplement or vitamin D to There are also several preventive steps women can take to avoid osteoporosis and resulting fractures or to keep osteoporosis from worsening. -->Recommendations include:  Eat a balanced diet high in fruits, vegetables, calcium, and vitamins.  Get enough calcium. The recommended total intake of is 1,200 mg daily; for best absorption, if taking supplements, divide doses into 250-500 mg doses throughout the day. Of the two types of calcium, calcium carbonate is best absorbed when taken with food but calcium citrate can be taken on an empty stomach.  Get enough vitamin D. NAMS and the National Osteoporosis Foundation recommend at least 1,000 IU per day for women age 50 and over who are at risk of vitamin D  deficiency. Vitamin D deficiency can be caused by inadequate sun exposure (for example, those who live in northern latitudes).  Avoid alcohol and smoking. Heavy alcohol intake (more than 7 drinks per week) increases the risk of falls and hip fracture and women smokers tend to lose bone more rapidly and have lower bone mass than nonsmokers. Stopping smoking is one of the most important changes women can make to improve their health and decrease risk for disease.  Be physically active every day. Weight-bearing exercise (for example, fast walking, hiking, jogging, and weight training) may strengthen bones or slow the rate of bone loss that comes with aging. Balancing and muscle-strengthening exercises can reduce the risk of falling and fracture.  Consider therapeutic medications. Currently, several types of effective drugs are available. Healthcare providers can recommend the type most appropriate for each woman.  Eliminate environmental factors that may contribute to accidents. Falls cause nearly 90% of all osteoporotic fractures, so reducing this risk is an important bone-health strategy. Measures include ample lighting, removing obstructions to walking, using nonskid rugs on floors, and placing mats and/or grab bars in showers.  Be aware of medication side effects. Some common medicines make bones weaker. These include a type of steroid drug called glucocorticoids used for arthritis and asthma, some antiseizure drugs, certain sleeping pills, treatments for endometriosis, and some cancer drugs. An overactive thyroid gland or using too much thyroid hormone for an underactive thyroid can also be a problem. If you are taking these medicines, talk to your doctor about what you can do to help protect your bones.reduce the rate of osteoporosis.    Menopause can be associated with physical symptoms and risks. Hormone replacement therapy is available to decrease symptoms and risks. You should talk to your  health care provider   about whether hormone replacement therapy is right for you.   Use sunscreen. Apply sunscreen liberally and repeatedly throughout the day. You should seek shade when your shadow is shorter than you. Protect yourself by wearing long sleeves, pants, a wide-brimmed hat, and sunglasses year round, whenever you are outdoors.   Once a month, do a whole body skin exam, using a mirror to look at the skin on your back. Tell your health care provider of new moles, moles that have irregular borders, moles that are larger than a pencil eraser, or moles that have changed in shape or color.   -Stay current with required vaccines (immunizations).   Influenza vaccine. All adults should be immunized every year.  Tetanus, diphtheria, and acellular pertussis (Td, Tdap) vaccine. Pregnant women should receive 1 dose of Tdap vaccine during each pregnancy. The dose should be obtained regardless of the length of time since the last dose. Immunization is preferred during the 27th 36th week of gestation. An adult who has not previously received Tdap or who does not know her vaccine status should receive 1 dose of Tdap. This initial dose should be followed by tetanus and diphtheria toxoids (Td) booster doses every 10 years. Adults with an unknown or incomplete history of completing a 3-dose immunization series with Td-containing vaccines should begin or complete a primary immunization series including a Tdap dose. Adults should receive a Td booster every 10 years.  Varicella vaccine. An adult without evidence of immunity to varicella should receive 2 doses or a second dose if she has previously received 1 dose. Pregnant females who do not have evidence of immunity should receive the first dose after pregnancy. This first dose should be obtained before leaving the health care facility. The second dose should be obtained 4 8 weeks after the first dose.  Human papillomavirus (HPV) vaccine. Females aged 13 26  years who have not received the vaccine previously should obtain the 3-dose series. The vaccine is not recommended for use in pregnant females. However, pregnancy testing is not needed before receiving a dose. If a female is found to be pregnant after receiving a dose, no treatment is needed. In that case, the remaining doses should be delayed until after the pregnancy. Immunization is recommended for any person with an immunocompromised condition through the age of 26 years if she did not get any or all doses earlier. During the 3-dose series, the second dose should be obtained 4 8 weeks after the first dose. The third dose should be obtained 24 weeks after the first dose and 16 weeks after the second dose.  Zoster vaccine. One dose is recommended for adults aged 60 years or older unless certain conditions are present.  Measles, mumps, and rubella (MMR) vaccine. Adults born before 1957 generally are considered immune to measles and mumps. Adults born in 1957 or later should have 1 or more doses of MMR vaccine unless there is a contraindication to the vaccine or there is laboratory evidence of immunity to each of the three diseases. A routine second dose of MMR vaccine should be obtained at least 28 days after the first dose for students attending postsecondary schools, health care workers, or international travelers. People who received inactivated measles vaccine or an unknown type of measles vaccine during 1963 1967 should receive 2 doses of MMR vaccine. People who received inactivated mumps vaccine or an unknown type of mumps vaccine before 1979 and are at high risk for mumps infection should consider immunization with 2 doses of   MMR vaccine. For females of childbearing age, rubella immunity should be determined. If there is no evidence of immunity, females who are not pregnant should be vaccinated. If there is no evidence of immunity, females who are pregnant should delay immunization until after pregnancy.  Unvaccinated health care workers born before [birth year] who lack laboratory evidence of measles, mumps, or rubella immunity or laboratory confirmation of disease should consider measles and mumps immunization with 2 doses of MMR vaccine or rubella immunization with 1 dose of MMR vaccine.  Pneumococcal 13-valent conjugate (PCV13) vaccine. When indicated, a person who is uncertain of her immunization history and has no record of immunization should receive the PCV13 vaccine. An adult aged 54 years or older who has certain medical conditions and has not been previously immunized should receive 1 dose of PCV13 vaccine. This PCV13 should be followed with a dose of pneumococcal polysaccharide (PPSV23) vaccine. The PPSV23 vaccine dose should be obtained at least 8 weeks after the dose of PCV13 vaccine. An adult aged 58 years or older who has certain medical conditions and previously received 1 or more doses of PPSV23 vaccine should receive 1 dose of PCV13. The PCV13 vaccine dose should be obtained 1 or more years after the last PPSV23 vaccine dose.  Pneumococcal polysaccharide (PPSV23) vaccine. When PCV13 is also indicated, PCV13 should be obtained first. All adults aged 58 years and older should be immunized. An adult younger than age 65 years who has certain medical conditions should be immunized. Any person who resides in a nursing home or long-term care facility should be immunized. An adult smoker should be immunized. People with an immunocompromised condition and certain other conditions should receive both PCV13 and PPSV23 vaccines. People with human immunodeficiency virus (HIV) infection should be immunized as soon as possible after diagnosis. Immunization during chemotherapy or radiation therapy should be avoided. Routine use of PPSV23 vaccine is not recommended for American Indians, [place] Natives, or people younger than 65 years unless there are medical conditions that require PPSV23 vaccine. When indicated,  people who have unknown immunization and have no record of immunization should receive PPSV23 vaccine. One-time revaccination 5 years after the first dose of PPSV23 is recommended for people aged 70 64 years who have chronic kidney failure, nephrotic syndrome, asplenia, or immunocompromised conditions. People who received 1 2 doses of PPSV23 before age 32 years should receive another dose of PPSV23 vaccine at age 96 years or later if at least 5 years have passed since the previous dose. Doses of PPSV23 are not needed for people immunized with PPSV23 at or after age 55 years.  Meningococcal vaccine. Adults with asplenia or persistent complement component deficiencies should receive 2 doses of quadrivalent meningococcal conjugate (MenACWY-D) vaccine. The doses should be obtained at least 2 months apart. Microbiologists working with certain meningococcal bacteria, [employer] recruits, people at risk during an outbreak, and people who travel to or live in countries with a high rate of meningitis should be immunized. A first-year college student up through age 58 years who is living in a residence hall should receive a dose if she did not receive a dose on or after her 16th birthday. Adults who have certain high-risk conditions should receive one or more doses of vaccine.  Hepatitis A vaccine. Adults who wish to be protected from this disease, have certain high-risk conditions, work with hepatitis A-infected animals, work in hepatitis A research labs, or travel to or work in countries with a high rate of hepatitis A should be  immunized. Adults who were previously unvaccinated and who anticipate close contact with an international adoptee during the first 60 days after arrival in the [place] from a country with a high rate of hepatitis A should be immunized.  Hepatitis B vaccine.  Adults who wish to be protected from this disease, have certain high-risk conditions, may be exposed to blood or other infectious  body fluids, are household contacts or sex partners of hepatitis B positive people, are clients or workers in certain care facilities, or travel to or work in countries with a high rate of hepatitis B should be immunized.  Haemophilus influenzae type b (Hib) vaccine. A previously unvaccinated person with asplenia or sickle cell disease or having a scheduled splenectomy should receive 1 dose of Hib vaccine. Regardless of previous immunization, a recipient of a hematopoietic stem cell transplant should receive a 3-dose series 6 12 months after her successful transplant. Hib vaccine is not recommended for adults with HIV infection.  Preventive Services / Frequency Ages 6 to 39years  Blood pressure check.** / Every 1 to 2 years.  Lipid and cholesterol check.** / Every 5 years beginning at age 39.  Clinical breast exam.** / Every 3 years for women in their 61s and 62s.  BRCA-related cancer risk assessment.** / For women who have family members with a BRCA-related cancer (breast, ovarian, tubal, or peritoneal cancers).  Pap test.** / Every 2 years from ages 47 through 85. Every 3 years starting at age 34 through age 12 or 74 with a history of 3 consecutive normal Pap tests.  HPV screening.** / Every 3 years from ages 46 through ages 43 to 54 with a history of 3 consecutive normal Pap tests.  Hepatitis C blood test.** / For any individual with known risks for hepatitis C.  Skin self-exam. / Monthly.  Influenza vaccine. / Every year.  Tetanus, diphtheria, and acellular pertussis (Tdap, Td) vaccine.** / Consult your health care provider. Pregnant women should receive 1 dose of Tdap vaccine during each pregnancy. 1 dose of Td every 10 years.  Varicella vaccine.** / Consult your health care provider. Pregnant females who do not have evidence of immunity should receive the first dose after pregnancy.  HPV vaccine. / 3 doses over 6 months, if 64 and younger. The vaccine is not recommended for use in  pregnant females. However, pregnancy testing is not needed before receiving a dose.  Measles, mumps, rubella (MMR) vaccine.** / You need at least 1 dose of MMR if you were born in 1957 or later. You may also need a 2nd dose. For females of childbearing age, rubella immunity should be determined. If there is no evidence of immunity, females who are not pregnant should be vaccinated. If there is no evidence of immunity, females who are pregnant should delay immunization until after pregnancy.  Pneumococcal 13-valent conjugate (PCV13) vaccine.** / Consult your health care provider.  Pneumococcal polysaccharide (PPSV23) vaccine.** / 1 to 2 doses if you smoke cigarettes or if you have certain conditions.  Meningococcal vaccine.** / 1 dose if you are age 71 to 37 years and a Market researcher living in a residence hall, or have one of several medical conditions, you need to get vaccinated against meningococcal disease. You may also need additional booster doses.  Hepatitis A vaccine.** / Consult your health care provider.  Hepatitis B vaccine.** / Consult your health care provider.  Haemophilus influenzae type b (Hib) vaccine.** / Consult your health care provider.  Ages 55 to 64years  Blood pressure check.** / Every 1 to 2 years.  Lipid and cholesterol check.** / Every 5 years beginning at age 20 years.  Lung cancer screening. / Every year if you are aged 55 80 years and have a 30-pack-year history of smoking and currently smoke or have quit within the past 15 years. Yearly screening is stopped once you have quit smoking for at least 15 years or develop a health problem that would prevent you from having lung cancer treatment.  Clinical breast exam.** / Every year after age 40 years.  BRCA-related cancer risk assessment.** / For women who have family members with a BRCA-related cancer (breast, ovarian, tubal, or peritoneal cancers).  Mammogram.** / Every year beginning at age 40  years and continuing for as long as you are in good health. Consult with your health care provider.  Pap test.** / Every 3 years starting at age 30 years through age 65 or 70 years with a history of 3 consecutive normal Pap tests.  HPV screening.** / Every 3 years from ages 30 years through ages 65 to 70 years with a history of 3 consecutive normal Pap tests.  Fecal occult blood test (FOBT) of stool. / Every year beginning at age 50 years and continuing until age 75 years. You may not need to do this test if you get a colonoscopy every 10 years.  Flexible sigmoidoscopy or colonoscopy.** / Every 5 years for a flexible sigmoidoscopy or every 10 years for a colonoscopy beginning at age 50 years and continuing until age 75 years.  Hepatitis C blood test.** / For all people born from 1945 through 1965 and any individual with known risks for hepatitis C.  Skin self-exam. / Monthly.  Influenza vaccine. / Every year.  Tetanus, diphtheria, and acellular pertussis (Tdap/Td) vaccine.** / Consult your health care provider. Pregnant women should receive 1 dose of Tdap vaccine during each pregnancy. 1 dose of Td every 10 years.  Varicella vaccine.** / Consult your health care provider. Pregnant females who do not have evidence of immunity should receive the first dose after pregnancy.  Zoster vaccine.** / 1 dose for adults aged 60 years or older.  Measles, mumps, rubella (MMR) vaccine.** / You need at least 1 dose of MMR if you were born in 1957 or later. You may also need a 2nd dose. For females of childbearing age, rubella immunity should be determined. If there is no evidence of immunity, females who are not pregnant should be vaccinated. If there is no evidence of immunity, females who are pregnant should delay immunization until after pregnancy.  Pneumococcal 13-valent conjugate (PCV13) vaccine.** / Consult your health care provider.  Pneumococcal polysaccharide (PPSV23) vaccine.** / 1 to 2 doses if  you smoke cigarettes or if you have certain conditions.  Meningococcal vaccine.** / Consult your health care provider.  Hepatitis A vaccine.** / Consult your health care provider.  Hepatitis B vaccine.** / Consult your health care provider.  Haemophilus influenzae type b (Hib) vaccine.** / Consult your health care provider.  Ages 65 years and over  Blood pressure check.** / Every 1 to 2 years.  Lipid and cholesterol check.** / Every 5 years beginning at age 20 years.  Lung cancer screening. / Every year if you are aged 55 80 years and have a 30-pack-year history of smoking and currently smoke or have quit within the past 15 years. Yearly screening is stopped once you have quit smoking for at least 15 years or develop a health problem that   would prevent you from having lung cancer treatment.  Clinical breast exam.** / Every year after age 103 years.  BRCA-related cancer risk assessment.** / For women who have family members with a BRCA-related cancer (breast, ovarian, tubal, or peritoneal cancers).  Mammogram.** / Every year beginning at age 36 years and continuing for as long as you are in good health. Consult with your health care provider.  Pap test.** / Every 3 years starting at age 5 years through age 85 or 10 years with 3 consecutive normal Pap tests. Testing can be stopped between 65 and 70 years with 3 consecutive normal Pap tests and no abnormal Pap or HPV tests in the past 10 years.  HPV screening.** / Every 3 years from ages 93 years through ages 70 or 45 years with a history of 3 consecutive normal Pap tests. Testing can be stopped between 65 and 70 years with 3 consecutive normal Pap tests and no abnormal Pap or HPV tests in the past 10 years.  Fecal occult blood test (FOBT) of stool. / Every year beginning at age 8 years and continuing until age 45 years. You may not need to do this test if you get a colonoscopy every 10 years.  Flexible sigmoidoscopy or colonoscopy.** /  Every 5 years for a flexible sigmoidoscopy or every 10 years for a colonoscopy beginning at age 69 years and continuing until age 68 years.  Hepatitis C blood test.** / For all people born from [birth year] through 1965 and any individual with known risks for hepatitis C.  Osteoporosis screening.** / A one-time screening for women ages 7 years and over and women at risk for fractures or osteoporosis.  Skin self-exam. / Monthly.  Influenza vaccine. / Every year.  Tetanus, diphtheria, and acellular pertussis (Tdap/Td) vaccine.** / 1 dose of Td every 10 years.  Varicella vaccine.** / Consult your health care provider.  Zoster vaccine.** / 1 dose for adults aged 5 years or older.  Pneumococcal 13-valent conjugate (PCV13) vaccine.** / Consult your health care provider.  Pneumococcal polysaccharide (PPSV23) vaccine.** / 1 dose for all adults aged 74 years and older.  Meningococcal vaccine.** / Consult your health care provider.  Hepatitis A vaccine.** / Consult your health care provider.  Hepatitis B vaccine.** / Consult your health care provider.  Haemophilus influenzae type b (Hib) vaccine.** / Consult your health care provider. ** Family history and personal history of risk and conditions may change your health care provider's recommendations. Document Released: 06/17/2001 Document Revised: 02/09/2013  [facility] Patient Information 2014 [place], [place].   EXERCISE AND DIET:  We recommended that you start or continue a regular exercise program for good health. Regular exercise means any activity that makes your heart beat faster and makes you sweat.  We recommend exercising at least 30 minutes per day at least 3 days a week, preferably 5.  We also recommend a diet low in fat and sugar / carbohydrates.  Inactivity, poor dietary choices and obesity can cause diabetes, heart attack, stroke, and kidney damage, among others.     ALCOHOL AND SMOKING:  Women should limit their alcohol intake to no  more than 7 drinks/beers/glasses of wine (combined, not each!) per week. Moderation of alcohol intake to this level decreases your risk of breast cancer and liver damage.  ( And of course, no recreational drugs are part of a healthy lifestyle.)  Also, you should not be smoking at all or even being exposed to second hand smoke. Most people know smoking can  cause cancer, and various heart and lung diseases, but did you know it also contributes to weakening of your bones?  Aging of your skin?  Yellowing of your teeth and nails?   CALCIUM AND VITAMIN D:  Adequate intake of calcium and Vitamin D are recommended.  The recommendations for exact amounts of these supplements seem to change often, but generally speaking 600 mg of calcium (either carbonate or citrate) and 800 units of Vitamin D per day seems prudent. Certain women may benefit from higher intake of Vitamin D.  If you are among these women, your doctor will have told you during your visit.     PAP SMEARS:  Pap smears, to check for cervical cancer or precancers,  have traditionally been done yearly, although recent scientific advances have shown that most women can have pap smears less often.  However, every woman still should have a physical exam from her gynecologist or primary care physician every year. It will include a breast check, inspection of the vulva and vagina to check for abnormal growths or skin changes, a visual exam of the cervix, and then an exam to evaluate the size and shape of the uterus and ovaries.  And after 47 years of age, a rectal exam is indicated to check for rectal cancers. We will also provide age appropriate advice regarding health maintenance, like when you should have certain vaccines, screening for sexually transmitted diseases, bone density testing, colonoscopy, mammograms, etc.    MAMMOGRAMS:  All women over 65 years old should have a yearly mammogram. Many facilities now offer a "3D" mammogram, which may cost  around $50 extra out of pocket. If possible,  we recommend you accept the option to have the 3D mammogram performed.  It both reduces the number of women who will be called back for extra views which then turn out to be normal, and it is better than the routine mammogram at detecting truly abnormal areas.     COLONOSCOPY:  Colonoscopy to screen for colon cancer is recommended for all women at age 69.  We know, you hate the idea of the prep.  We agree, BUT, having colon cancer and not knowing it is worse!!  Colon cancer so often starts as a polyp that can be seen and removed at colonscopy, which can quite literally save your life!  And if your first colonoscopy is normal and you have no family history of colon cancer, most women don't have to have it again for 10 years.  Once every ten years, you can do something that may end up saving your life, right?  We will be happy to help you get it scheduled when you are ready.  Be sure to check your insurance coverage so you understand how much it will cost.  It may be covered as a preventative service at no cost, but you should check your particular policy.     Mediterranean Diet A Mediterranean diet refers to food and lifestyle choices that are based on the traditions of countries located on the [employer]. This way of eating has been shown to help prevent certain conditions and improve outcomes for people who have chronic diseases, like kidney disease and heart disease. What are tips for following this plan? Lifestyle  Cook and eat meals together with your family, when possible.  Drink enough fluid to keep your urine clear or pale yellow.  Be physically active every day. This includes: ? Aerobic exercise like running or swimming. ? Leisure activities  like gardening, walking, or housework.  Get 7-8 hours of sleep each night.  If recommended by your health care provider, drink red wine in moderation. This means 1 glass a day for nonpregnant  women and 2 glasses a day for men. A glass of wine equals 5 oz (150 mL). Reading food labels  Check the serving size of packaged foods. For foods such as rice and pasta, the serving size refers to the amount of cooked product, not dry.  Check the total fat in packaged foods. Avoid foods that have saturated fat or trans fats.  Check the ingredients list for added sugars, such as corn syrup. Shopping  At the grocery store, buy most of your food from the areas near the walls of the store. This includes: ? Fresh fruits and vegetables (produce). ? Grains, beans, nuts, and seeds. Some of these may be available in unpackaged forms or large amounts (in bulk). ? Fresh seafood. ? Poultry and eggs. ? Low-fat dairy products.  Buy whole ingredients instead of prepackaged foods.  Buy fresh fruits and vegetables in-season from local farmers markets.  Buy frozen fruits and vegetables in resealable bags.  If you do not have access to quality fresh seafood, buy precooked frozen shrimp or canned fish, such as tuna, salmon, or sardines.  Buy small amounts of raw or cooked vegetables, salads, or olives from the deli or salad bar at your store.  Stock your pantry so you always have certain foods on hand, such as olive oil, canned tuna, canned tomatoes, rice, pasta, and beans. Cooking  Cook foods with extra-virgin olive oil instead of using butter or other vegetable oils.  Have meat as a side dish, and have vegetables or grains as your main dish. This means having meat in small portions or adding small amounts of meat to foods like pasta or stew.  Use beans or vegetables instead of meat in common dishes like chili or lasagna.  Experiment with different cooking methods. Try roasting or broiling vegetables instead of steaming or sauteing them.  Add frozen vegetables to soups, stews, pasta, or rice.  Add nuts or seeds for added healthy fat at each meal. You can add these to yogurt, salads, or  vegetable dishes.  Marinate fish or vegetables using olive oil, lemon juice, garlic, and fresh herbs. Meal planning  Plan to eat 1 vegetarian meal one day each week. Try to work up to 2 vegetarian meals, if possible.  Eat seafood 2 or more times a week.  Have healthy snacks readily available, such as: ? Vegetable sticks with hummus. ? [place] yogurt. ? Fruit and nut trail mix.  Eat balanced meals throughout the week. This includes: ? Fruit: 2-3 servings a day ? Vegetables: 4-5 servings a day ? Low-fat dairy: 2 servings a day ? Fish, poultry, or lean meat: 1 serving a day ? Beans and legumes: 2 or more servings a week ? Nuts and seeds: 1-2 servings a day ? Whole grains: 6-8 servings a day ? Extra-virgin olive oil: 3-4 servings a day  Limit red meat and sweets to only a few servings a month What are my food choices?  Mediterranean diet ? Recommended ? Grains: Whole-grain pasta. Brown rice. Bulgar wheat. Polenta. Couscous. Whole-wheat bread. [place]. ? Vegetables: Artichokes. Beets. Broccoli. Cabbage. Carrots. Eggplant. Green beans. Chard. Kale. Spinach. Onions. Leeks. Peas. Squash. Tomatoes. Peppers. Radishes. ? Fruits: Apples. Apricots. Avocado. Berries. Bananas. Cherries. Dates. Figs. Grapes. Lemons. Melon. Oranges. Peaches. Plums. Pomegranate. ? Meats and other  protein foods: Beans. Almonds. Sunflower seeds. Pine nuts. Peanuts. [place]. Salmon. Scallops. Shrimp. [place]. Tilapia. Clams. Oysters. Eggs. ? Dairy: Low-fat milk. Cheese. Greek yogurt. ? Beverages: Water. Red wine. Herbal tea. ? Fats and oils: Extra virgin olive oil. Avocado oil. Grape seed oil. ? Sweets and desserts: [place] yogurt with honey. Baked apples. Poached pears. Trail mix. ? Seasoning and other foods: Basil. Cilantro. Coriander. Cumin. Mint. Parsley. Sage. Rosemary. Tarragon. Garlic. Oregano. Thyme. Pepper. Balsalmic vinegar. Tahini. Hummus. Tomato sauce. Olives. Mushrooms. ? Limit these ? Grains: Prepackaged  pasta or rice dishes. Prepackaged cereal with added sugar. ? Vegetables: Deep fried potatoes (french fries). ? Fruits: Fruit canned in syrup. ? Meats and other protein foods: Beef. Pork. Lamb. Poultry with skin. Hot dogs. [place] Salines. ? Dairy: Ice cream. Sour cream. Whole milk. ? Beverages: Juice. Sugar-sweetened soft drinks. Beer. Liquor and spirits. ? Fats and oils: Butter. Canola oil. Vegetable oil. Beef fat (tallow). Lard. ? Sweets and desserts: Cookies. Cakes. Pies. Candy. ? Seasoning and other foods: Mayonnaise. Premade sauces and marinades. ? The items listed may not be a complete list. Talk with your dietitian about what dietary choices are right for you. Summary  The Mediterranean diet includes both food and lifestyle choices.  Eat a variety of fresh fruits and vegetables, beans, nuts, seeds, and whole grains.  Limit the amount of red meat and sweets that you eat.  Talk with your health care provider about whether it is safe for you to drink red wine in moderation. This means 1 glass a day for nonpregnant women and 2 glasses a day for men. A glass of wine equals 5 oz (150 mL). This information is not intended to replace advice given to you by your health care provider. Make sure you discuss any questions you have with your health care provider. Document Released: 12/13/2015 Document Revised: 01/15/2016 Document Reviewed: 12/13/2015 Elsevier Interactive Patient Education  2018 [place] all medications as directed. Continue to drink plenty of water and follow Mediterranean diet. Continue regular exercise. Dermatology referral placed. Please schedule fasting lab appt at your convenience. If your large breasts continue to cause neck/back pain despite continued weight loss in 6 months, call clinic and plastic referral will be placed. We will call you with PAP results. Someone from [facility] will call you to schedule Mammogram appt.. Overall you are doing a great job  taking care of yourself! NICE TO SEE YOU!

## 2018-03-15 NOTE — Assessment & Plan Note (Signed)
Continue all medications as directed. Continue to drink plenty of water and follow Mediterranean diet. Continue regular exercise. Dermatology referral placed. Please schedule fasting lab appt at your convenience. If your large breasts continue to cause neck/back pain despite continued weight loss in 6 months, call clinic and plastic referral will be placed. We will call you with PAP results. Someone from [facility] will call you to schedule Mammogram appt.. Overall you are doing a great job taking care of yourself!

## 2018-03-16 ENCOUNTER — Other Ambulatory Visit: Payer: BLUE CROSS/BLUE SHIELD

## 2018-03-16 DIAGNOSIS — Z0000 Encounter for general adult medical examination without abnormal findings: Secondary | ICD-10-CM

## 2018-03-17 LAB — COMPREHENSIVE METABOLIC PANEL
A/G RATIO: 1.9 (ref 1.2–2.2)
ALK PHOS: 67 IU/L (ref 39–117)
ALT: 26 IU/L (ref 0–32)
AST: 22 IU/L (ref 0–40)
Albumin: 4.4 g/dL (ref 3.5–5.5)
BILIRUBIN TOTAL: 1 mg/dL (ref 0.0–1.2)
BUN / CREAT RATIO: 14 (ref 9–23)
BUN: 12 mg/dL (ref 6–24)
CO2: 26 mmol/L (ref 20–29)
CREATININE: 0.86 mg/dL (ref 0.57–1.00)
Calcium: 9.4 mg/dL (ref 8.7–10.2)
Chloride: 99 mmol/L (ref 96–106)
GFR, EST AFRICAN AMERICAN: 93 mL/min/{1.73_m2} (ref >59)
GFR, EST NON AFRICAN AMERICAN: 81 mL/min/{1.73_m2} (ref >59)
GLUCOSE: 100 mg/dL — AB (ref 65–99)
Globulin, Total: 2.3 g/dL (ref 1.5–4.5)
POTASSIUM: 4.3 mmol/L (ref 3.5–5.2)
SODIUM: 139 mmol/L (ref 134–144)
TOTAL PROTEIN: 6.7 g/dL (ref 6.0–8.5)

## 2018-03-17 LAB — CBC WITH DIFFERENTIAL/PLATELET
BASOS: 1 %
Basophils Absolute: 0 10*3/uL (ref 0.0–0.2)
EOS (ABSOLUTE): 0.2 10*3/uL (ref 0.0–0.4)
EOS: 2 %
HEMATOCRIT: 42.7 % (ref 34.0–46.6)
Hemoglobin: 15.1 g/dL (ref 11.1–15.9)
IMMATURE GRANULOCYTES: 0 %
Immature Grans (Abs): 0 10*3/uL (ref 0.0–0.1)
Lymphocytes Absolute: 1.9 10*3/uL (ref 0.7–3.1)
Lymphs: 28 %
MCH: 34.2 pg — AB (ref 26.6–33.0)
MCHC: 35.4 g/dL (ref 31.5–35.7)
MCV: 97 fL (ref 79–97)
MONOS ABS: 0.5 10*3/uL (ref 0.1–0.9)
Monocytes: 7 %
NEUTROS ABS: 4.2 10*3/uL (ref 1.4–7.0)
NEUTROS PCT: 62 %
PLATELETS: 186 10*3/uL (ref 150–450)
RBC: 4.42 x10E6/uL (ref 3.77–5.28)
RDW: 12.7 % (ref 12.3–15.4)
WBC: 6.7 10*3/uL (ref 3.4–10.8)

## 2018-03-17 LAB — LIPID PANEL
CHOL/HDL RATIO: 4.1 ratio (ref 0.0–4.4)
CHOLESTEROL TOTAL: 210 mg/dL — AB (ref 100–199)
HDL: 51 mg/dL (ref >39)
LDL CALC: 133 mg/dL — AB (ref 0–99)
Triglycerides: 130 mg/dL (ref 0–149)
VLDL CHOLESTEROL CAL: 26 mg/dL (ref 5–40)

## 2018-03-17 LAB — CYTOLOGY - PAP
DIAGNOSIS: NEGATIVE
HPV: NOT DETECTED

## 2018-03-17 LAB — HEMOGLOBIN A1C
Est. average glucose Bld gHb Est-mCnc: 103 mg/dL
Hgb A1c MFr Bld: 5.2 % (ref 4.8–5.6)

## 2018-03-17 NOTE — Progress Notes (Signed)
Please put in referral She currently in [facility] and has cervical neck and thoracic back pain Thanks! Valetta Fuller

## 2018-03-24 DIAGNOSIS — L821 Other seborrheic keratosis: Secondary | ICD-10-CM | POA: Diagnosis not present

## 2018-03-24 DIAGNOSIS — D485 Neoplasm of uncertain behavior of skin: Secondary | ICD-10-CM | POA: Diagnosis not present

## 2019-05-19 ENCOUNTER — Other Ambulatory Visit: Payer: Self-pay

## 2019-05-19 ENCOUNTER — Encounter: Payer: Self-pay | Admitting: Adult Health

## 2019-05-19 ENCOUNTER — Ambulatory Visit (INDEPENDENT_AMBULATORY_CARE_PROVIDER_SITE_OTHER): Payer: Self-pay | Admitting: Adult Health

## 2019-05-19 VITALS — BP 136/89 | HR 76 | Temp 98.8°F | Ht 62.5 in | Wt 180.3 lb

## 2019-05-19 DIAGNOSIS — R5383 Other fatigue: Secondary | ICD-10-CM

## 2019-05-19 DIAGNOSIS — E039 Hypothyroidism, unspecified: Secondary | ICD-10-CM

## 2019-05-19 DIAGNOSIS — M79642 Pain in left hand: Secondary | ICD-10-CM

## 2019-05-19 DIAGNOSIS — M79641 Pain in right hand: Secondary | ICD-10-CM

## 2019-05-19 DIAGNOSIS — R635 Abnormal weight gain: Secondary | ICD-10-CM

## 2019-05-19 DIAGNOSIS — E0500 Thyrotoxicosis with diffuse goiter without thyrotoxic crisis or storm: Secondary | ICD-10-CM

## 2019-05-19 DIAGNOSIS — Z0000 Encounter for general adult medical examination without abnormal findings: Secondary | ICD-10-CM

## 2019-05-19 DIAGNOSIS — Z1231 Encounter for screening mammogram for malignant neoplasm of breast: Secondary | ICD-10-CM

## 2019-05-19 MED ORDER — NORETHINDRONE 0.35 MG PO TABS: 1.0000 | ORAL_TABLET | Freq: Every day | ORAL | 11 refills | Status: DC

## 2019-05-19 MED ORDER — ESCITALOPRAM OXALATE 5 MG PO TABS: 5.0000 mg | ORAL_TABLET | Freq: Every day | ORAL | 0 refills | Status: DC

## 2019-05-19 NOTE — Patient Instructions (Signed)
Preventive Care for Adults, Female  A healthy lifestyle and preventive care can promote health and wellness. Preventive health guidelines for women include the following key practices.   A routine yearly physical is a good way to check with your health care provider about your health and preventive screening. It is a chance to share any concerns and updates on your health and to receive a thorough exam.   Visit your dentist for a routine exam and preventive care every 6 months. Brush your teeth twice a day and floss once a day. Good oral hygiene prevents tooth decay and gum disease.   The frequency of eye exams is based on your age, health, family medical history, use of contact lenses, and other factors. Follow your health care provider's recommendations for frequency of eye exams.   Eat a healthy diet. Foods like vegetables, fruits, whole grains, low-fat dairy products, and lean protein foods contain the nutrients you need without too many calories. Decrease your intake of foods high in solid fats, added sugars, and salt. Eat the right amount of calories for you.Get information about a proper diet from your health care provider, if necessary.   Regular physical exercise is one of the most important things you can do for your health. Most adults should get at least 150 minutes of moderate-intensity exercise (any activity that increases your heart rate and causes you to sweat) each week. In addition, most adults need muscle-strengthening exercises on 2 or more days a week.   Maintain a healthy weight. The body mass index (BMI) is a screening tool to identify possible weight problems. It provides an estimate of body fat based on height and weight. Your health care provider can find your BMI, and can help you achieve or maintain a healthy weight.For adults 20 years and older:   - A BMI below 18.5 is considered underweight.   - A BMI of 18.5 to 24.9 is normal.   - A BMI of 25 to 29.9 is  considered overweight.   - A BMI of 30 and above is considered obese.   Maintain normal blood lipids and cholesterol levels by exercising and minimizing your intake of trans and saturated fats.  Eat a balanced diet with plenty of fruit and vegetables. Blood tests for lipids and cholesterol should begin at age 20 and be repeated every 5 years minimum.  If your lipid or cholesterol levels are high, you are over 40, or you are at high risk for heart disease, you may need your cholesterol levels checked more frequently.Ongoing high lipid and cholesterol levels should be treated with medicines if diet and exercise are not working.   If you smoke, find out from your health care provider how to quit. If you do not use tobacco, do not start.   Lung cancer screening is recommended for adults aged 55-80 years who are at high risk for developing lung cancer because of a history of smoking. A yearly low-dose CT scan of the lungs is recommended for people who have at least a 30-pack-year history of smoking and are a current smoker or have quit within the past 15 years. A pack year of smoking is smoking an average of 1 pack of cigarettes a day for 1 year (for example: 1 pack a day for 30 years or 2 packs a day for 15 years). Yearly screening should continue until the smoker has stopped smoking for at least 15 years. Yearly screening should be stopped for people who develop a   health problem that would prevent them from having lung cancer treatment.   If you are pregnant, do not drink alcohol. If you are breastfeeding, be very cautious about drinking alcohol. If you are not pregnant and choose to drink alcohol, do not have more than 1 drink per day. One drink is considered to be 12 ounces (355 mL) of beer, 5 ounces (148 mL) of wine, or 1.5 ounces (44 mL) of liquor.   Avoid use of street drugs. Do not share needles with anyone. Ask for help if you need support or instructions about stopping the use of  drugs.   High blood pressure causes heart disease and increases the risk of stroke. Your blood pressure should be checked at least yearly.  Ongoing high blood pressure should be treated with medicines if weight loss and exercise do not work.   If you are 69-55 years old, ask your health care provider if you should take aspirin to prevent strokes.   Diabetes screening involves taking a blood sample to check your fasting blood sugar level. This should be done once every 3 years, after age 38, if you are within normal weight and without risk factors for diabetes. Testing should be considered at a younger age or be carried out more frequently if you are overweight and have at least 1 risk factor for diabetes.   Breast cancer screening is essential preventive care for women. You should practice "breast self-awareness."  This means understanding the normal appearance and feel of your breasts and may include breast self-examination.  Any changes detected, no matter how small, should be reported to a health care provider.  Women in their 80s and 30s should have a clinical breast exam (CBE) by a health care provider as part of a regular health exam every 1 to 3 years.  After age 66, women should have a CBE every year.  Starting at age 1, women should consider having a mammogram (breast X-ray test) every year.  Women who have a family history of breast cancer should talk to their health care provider about genetic screening.  Women at a high risk of breast cancer should talk to their health care providers about having an MRI and a mammogram every year.   -Breast cancer gene (BRCA)-related cancer risk assessment is recommended for women who have family members with BRCA-related cancers. BRCA-related cancers include breast, ovarian, tubal, and peritoneal cancers. Having family members with these cancers may be associated with an increased risk for harmful changes (mutations) in the breast cancer genes BRCA1 and  BRCA2. Results of the assessment will determine the need for genetic counseling and BRCA1 and BRCA2 testing.   The Pap test is a screening test for cervical cancer. A Pap test can show cell changes on the cervix that might become cervical cancer if left untreated. A Pap test is a procedure in which cells are obtained and examined from the lower end of the uterus (cervix).   - Women should have a Pap test starting at age 57.   - Between ages 90 and 70, Pap tests should be repeated every 2 years.   - Beginning at age 63, you should have a Pap test every 3 years as long as the past 3 Pap tests have been normal.   - Some women have medical problems that increase the chance of getting cervical cancer. Talk to your health care provider about these problems. It is especially important to talk to your health care provider if a  new problem develops soon after your last Pap test. In these cases, your health care provider may recommend more frequent screening and Pap tests.   - The above recommendations are the same for women who have or have not gotten the vaccine for human papillomavirus (HPV).   - If you had a hysterectomy for a problem that was not cancer or a condition that could lead to cancer, then you no longer need Pap tests. Even if you no longer need a Pap test, a regular exam is a good idea to make sure no other problems are starting.   - If you are between ages 36 and 66 years, and you have had normal Pap tests going back 10 years, you no longer need Pap tests. Even if you no longer need a Pap test, a regular exam is a good idea to make sure no other problems are starting.   - If you have had past treatment for cervical cancer or a condition that could lead to cancer, you need Pap tests and screening for cancer for at least 20 years after your treatment.   - If Pap tests have been discontinued, risk factors (such as a new sexual partner) need to be reassessed to determine if screening should  be resumed.   - The HPV test is an additional test that may be used for cervical cancer screening. The HPV test looks for the virus that can cause the cell changes on the cervix. The cells collected during the Pap test can be tested for HPV. The HPV test could be used to screen women aged 70 years and older, and should be used in women of any age who have unclear Pap test results. After the age of 67, women should have HPV testing at the same frequency as a Pap test.   Colorectal cancer can be detected and often prevented. Most routine colorectal cancer screening begins at the age of 57 years and continues through age 26 years. However, your health care provider may recommend screening at an earlier age if you have risk factors for colon cancer. On a yearly basis, your health care provider may provide home test kits to check for hidden blood in the stool.  Use of a small camera at the end of a tube, to directly examine the colon (sigmoidoscopy or colonoscopy), can detect the earliest forms of colorectal cancer. Talk to your health care provider about this at age 23, when routine screening begins. Direct exam of the colon should be repeated every 5 -10 years through age 49 years, unless early forms of pre-cancerous polyps or small growths are found.   People who are at an increased risk for hepatitis B should be screened for this virus. You are considered at high risk for hepatitis B if:  -You were born in a country where hepatitis B occurs often. Talk with your health care provider about which countries are considered high risk.  - Your parents were born in a high-risk country and you have not received a shot to protect against hepatitis B (hepatitis B vaccine).  - You have HIV or AIDS.  - You use needles to inject street drugs.  - You live with, or have sex with, someone who has Hepatitis B.  - You get hemodialysis treatment.  - You take certain medicines for conditions like cancer, organ  transplantation, and autoimmune conditions.   Hepatitis C blood testing is recommended for all people born from [birth year] through 1965 and any individual  with known risks for hepatitis C.   Practice safe sex. Use condoms and avoid high-risk sexual practices to reduce the spread of sexually transmitted infections (STIs). STIs include gonorrhea, chlamydia, syphilis, trichomonas, herpes, HPV, and human immunodeficiency virus (HIV). Herpes, HIV, and HPV are viral illnesses that have no cure. They can result in disability, cancer, and death. Sexually active women aged 25 years and younger should be checked for chlamydia. Older women with new or multiple partners should also be tested for chlamydia. Testing for other STIs is recommended if you are sexually active and at increased risk.   Osteoporosis is a disease in which the bones lose minerals and strength with aging. This can result in serious bone fractures or breaks. The risk of osteoporosis can be identified using a bone density scan. Women ages 65 years and over and women at risk for fractures or osteoporosis should discuss screening with their health care providers. Ask your health care provider whether you should take a calcium supplement or vitamin D to There are also several preventive steps women can take to avoid osteoporosis and resulting fractures or to keep osteoporosis from worsening. -->Recommendations include:  Eat a balanced diet high in fruits, vegetables, calcium, and vitamins.  Get enough calcium. The recommended total intake of is 1,200 mg daily; for best absorption, if taking supplements, divide doses into 250-500 mg doses throughout the day. Of the two types of calcium, calcium carbonate is best absorbed when taken with food but calcium citrate can be taken on an empty stomach.  Get enough vitamin D. NAMS and the National Osteoporosis Foundation recommend at least 1,000 IU per day for women age 50 and over who are at risk of vitamin D  deficiency. Vitamin D deficiency can be caused by inadequate sun exposure (for example, those who live in northern latitudes).  Avoid alcohol and smoking. Heavy alcohol intake (more than 7 drinks per week) increases the risk of falls and hip fracture and women smokers tend to lose bone more rapidly and have lower bone mass than nonsmokers. Stopping smoking is one of the most important changes women can make to improve their health and decrease risk for disease.  Be physically active every day. Weight-bearing exercise (for example, fast walking, hiking, jogging, and weight training) may strengthen bones or slow the rate of bone loss that comes with aging. Balancing and muscle-strengthening exercises can reduce the risk of falling and fracture.  Consider therapeutic medications. Currently, several types of effective drugs are available. Healthcare providers can recommend the type most appropriate for each woman.  Eliminate environmental factors that may contribute to accidents. Falls cause nearly 90% of all osteoporotic fractures, so reducing this risk is an important bone-health strategy. Measures include ample lighting, removing obstructions to walking, using nonskid rugs on floors, and placing mats and/or grab bars in showers.  Be aware of medication side effects. Some common medicines make bones weaker. These include a type of steroid drug called glucocorticoids used for arthritis and asthma, some antiseizure drugs, certain sleeping pills, treatments for endometriosis, and some cancer drugs. An overactive thyroid gland or using too much thyroid hormone for an underactive thyroid can also be a problem. If you are taking these medicines, talk to your doctor about what you can do to help protect your bones.reduce the rate of osteoporosis.    Menopause can be associated with physical symptoms and risks. Hormone replacement therapy is available to decrease symptoms and risks. You should talk to your  health care provider   about whether hormone replacement therapy is right for you.   Use sunscreen. Apply sunscreen liberally and repeatedly throughout the day. You should seek shade when your shadow is shorter than you. Protect yourself by wearing long sleeves, pants, a wide-brimmed hat, and sunglasses year round, whenever you are outdoors.   Once a month, do a whole body skin exam, using a mirror to look at the skin on your back. Tell your health care provider of new moles, moles that have irregular borders, moles that are larger than a pencil eraser, or moles that have changed in shape or color.   -Stay current with required vaccines (immunizations).   Influenza vaccine. All adults should be immunized every year.  Tetanus, diphtheria, and acellular pertussis (Td, Tdap) vaccine. Pregnant women should receive 1 dose of Tdap vaccine during each pregnancy. The dose should be obtained regardless of the length of time since the last dose. Immunization is preferred during the 27th 36th week of gestation. An adult who has not previously received Tdap or who does not know her vaccine status should receive 1 dose of Tdap. This initial dose should be followed by tetanus and diphtheria toxoids (Td) booster doses every 10 years. Adults with an unknown or incomplete history of completing a 3-dose immunization series with Td-containing vaccines should begin or complete a primary immunization series including a Tdap dose. Adults should receive a Td booster every 10 years.  Varicella vaccine. An adult without evidence of immunity to varicella should receive 2 doses or a second dose if she has previously received 1 dose. Pregnant females who do not have evidence of immunity should receive the first dose after pregnancy. This first dose should be obtained before leaving the health care facility. The second dose should be obtained 4 8 weeks after the first dose.  Human papillomavirus (HPV) vaccine. Females aged 13 26  years who have not received the vaccine previously should obtain the 3-dose series. The vaccine is not recommended for use in pregnant females. However, pregnancy testing is not needed before receiving a dose. If a female is found to be pregnant after receiving a dose, no treatment is needed. In that case, the remaining doses should be delayed until after the pregnancy. Immunization is recommended for any person with an immunocompromised condition through the age of 26 years if she did not get any or all doses earlier. During the 3-dose series, the second dose should be obtained 4 8 weeks after the first dose. The third dose should be obtained 24 weeks after the first dose and 16 weeks after the second dose.  Zoster vaccine. One dose is recommended for adults aged 60 years or older unless certain conditions are present.  Measles, mumps, and rubella (MMR) vaccine. Adults born before 1957 generally are considered immune to measles and mumps. Adults born in 1957 or later should have 1 or more doses of MMR vaccine unless there is a contraindication to the vaccine or there is laboratory evidence of immunity to each of the three diseases. A routine second dose of MMR vaccine should be obtained at least 28 days after the first dose for students attending postsecondary schools, health care workers, or international travelers. People who received inactivated measles vaccine or an unknown type of measles vaccine during 1963 1967 should receive 2 doses of MMR vaccine. People who received inactivated mumps vaccine or an unknown type of mumps vaccine before 1979 and are at high risk for mumps infection should consider immunization with 2 doses of   MMR vaccine. For females of childbearing age, rubella immunity should be determined. If there is no evidence of immunity, females who are not pregnant should be vaccinated. If there is no evidence of immunity, females who are pregnant should delay immunization until after pregnancy.  Unvaccinated health care workers born before [birth year] who lack laboratory evidence of measles, mumps, or rubella immunity or laboratory confirmation of disease should consider measles and mumps immunization with 2 doses of MMR vaccine or rubella immunization with 1 dose of MMR vaccine.  Pneumococcal 13-valent conjugate (PCV13) vaccine. When indicated, a person who is uncertain of her immunization history and has no record of immunization should receive the PCV13 vaccine. An adult aged 54 years or older who has certain medical conditions and has not been previously immunized should receive 1 dose of PCV13 vaccine. This PCV13 should be followed with a dose of pneumococcal polysaccharide (PPSV23) vaccine. The PPSV23 vaccine dose should be obtained at least 8 weeks after the dose of PCV13 vaccine. An adult aged 58 years or older who has certain medical conditions and previously received 1 or more doses of PPSV23 vaccine should receive 1 dose of PCV13. The PCV13 vaccine dose should be obtained 1 or more years after the last PPSV23 vaccine dose.  Pneumococcal polysaccharide (PPSV23) vaccine. When PCV13 is also indicated, PCV13 should be obtained first. All adults aged 58 years and older should be immunized. An adult younger than age 65 years who has certain medical conditions should be immunized. Any person who resides in a nursing home or long-term care facility should be immunized. An adult smoker should be immunized. People with an immunocompromised condition and certain other conditions should receive both PCV13 and PPSV23 vaccines. People with human immunodeficiency virus (HIV) infection should be immunized as soon as possible after diagnosis. Immunization during chemotherapy or radiation therapy should be avoided. Routine use of PPSV23 vaccine is not recommended for American Indians, [place] Natives, or people younger than 65 years unless there are medical conditions that require PPSV23 vaccine. When indicated,  people who have unknown immunization and have no record of immunization should receive PPSV23 vaccine. One-time revaccination 5 years after the first dose of PPSV23 is recommended for people aged 70 64 years who have chronic kidney failure, nephrotic syndrome, asplenia, or immunocompromised conditions. People who received 1 2 doses of PPSV23 before age 32 years should receive another dose of PPSV23 vaccine at age 96 years or later if at least 5 years have passed since the previous dose. Doses of PPSV23 are not needed for people immunized with PPSV23 at or after age 55 years.  Meningococcal vaccine. Adults with asplenia or persistent complement component deficiencies should receive 2 doses of quadrivalent meningococcal conjugate (MenACWY-D) vaccine. The doses should be obtained at least 2 months apart. Microbiologists working with certain meningococcal bacteria, [employer] recruits, people at risk during an outbreak, and people who travel to or live in countries with a high rate of meningitis should be immunized. A first-year college student up through age 58 years who is living in a residence hall should receive a dose if she did not receive a dose on or after her 16th birthday. Adults who have certain high-risk conditions should receive one or more doses of vaccine.  Hepatitis A vaccine. Adults who wish to be protected from this disease, have certain high-risk conditions, work with hepatitis A-infected animals, work in hepatitis A research labs, or travel to or work in countries with a high rate of hepatitis A should be  immunized. Adults who were previously unvaccinated and who anticipate close contact with an international adoptee during the first 60 days after arrival in the [place] from a country with a high rate of hepatitis A should be immunized.  Hepatitis B vaccine.  Adults who wish to be protected from this disease, have certain high-risk conditions, may be exposed to blood or other infectious  body fluids, are household contacts or sex partners of hepatitis B positive people, are clients or workers in certain care facilities, or travel to or work in countries with a high rate of hepatitis B should be immunized.  Haemophilus influenzae type b (Hib) vaccine. A previously unvaccinated person with asplenia or sickle cell disease or having a scheduled splenectomy should receive 1 dose of Hib vaccine. Regardless of previous immunization, a recipient of a hematopoietic stem cell transplant should receive a 3-dose series 6 12 months after her successful transplant. Hib vaccine is not recommended for adults with HIV infection.  Preventive Services / Frequency Ages 6 to 39years  Blood pressure check.** / Every 1 to 2 years.  Lipid and cholesterol check.** / Every 5 years beginning at age 39.  Clinical breast exam.** / Every 3 years for women in their 61s and 62s.  BRCA-related cancer risk assessment.** / For women who have family members with a BRCA-related cancer (breast, ovarian, tubal, or peritoneal cancers).  Pap test.** / Every 2 years from ages 47 through 85. Every 3 years starting at age 34 through age 12 or 74 with a history of 3 consecutive normal Pap tests.  HPV screening.** / Every 3 years from ages 46 through ages 43 to 54 with a history of 3 consecutive normal Pap tests.  Hepatitis C blood test.** / For any individual with known risks for hepatitis C.  Skin self-exam. / Monthly.  Influenza vaccine. / Every year.  Tetanus, diphtheria, and acellular pertussis (Tdap, Td) vaccine.** / Consult your health care provider. Pregnant women should receive 1 dose of Tdap vaccine during each pregnancy. 1 dose of Td every 10 years.  Varicella vaccine.** / Consult your health care provider. Pregnant females who do not have evidence of immunity should receive the first dose after pregnancy.  HPV vaccine. / 3 doses over 6 months, if 64 and younger. The vaccine is not recommended for use in  pregnant females. However, pregnancy testing is not needed before receiving a dose.  Measles, mumps, rubella (MMR) vaccine.** / You need at least 1 dose of MMR if you were born in 1957 or later. You may also need a 2nd dose. For females of childbearing age, rubella immunity should be determined. If there is no evidence of immunity, females who are not pregnant should be vaccinated. If there is no evidence of immunity, females who are pregnant should delay immunization until after pregnancy.  Pneumococcal 13-valent conjugate (PCV13) vaccine.** / Consult your health care provider.  Pneumococcal polysaccharide (PPSV23) vaccine.** / 1 to 2 doses if you smoke cigarettes or if you have certain conditions.  Meningococcal vaccine.** / 1 dose if you are age 71 to 37 years and a Market researcher living in a residence hall, or have one of several medical conditions, you need to get vaccinated against meningococcal disease. You may also need additional booster doses.  Hepatitis A vaccine.** / Consult your health care provider.  Hepatitis B vaccine.** / Consult your health care provider.  Haemophilus influenzae type b (Hib) vaccine.** / Consult your health care provider.  Ages 55 to 64years  Blood pressure check.** / Every 1 to 2 years.  Lipid and cholesterol check.** / Every 5 years beginning at age 20 years.  Lung cancer screening. / Every year if you are aged 55 80 years and have a 30-pack-year history of smoking and currently smoke or have quit within the past 15 years. Yearly screening is stopped once you have quit smoking for at least 15 years or develop a health problem that would prevent you from having lung cancer treatment.  Clinical breast exam.** / Every year after age 40 years.  BRCA-related cancer risk assessment.** / For women who have family members with a BRCA-related cancer (breast, ovarian, tubal, or peritoneal cancers).  Mammogram.** / Every year beginning at age 40  years and continuing for as long as you are in good health. Consult with your health care provider.  Pap test.** / Every 3 years starting at age 30 years through age 65 or 70 years with a history of 3 consecutive normal Pap tests.  HPV screening.** / Every 3 years from ages 30 years through ages 65 to 70 years with a history of 3 consecutive normal Pap tests.  Fecal occult blood test (FOBT) of stool. / Every year beginning at age 50 years and continuing until age 75 years. You may not need to do this test if you get a colonoscopy every 10 years.  Flexible sigmoidoscopy or colonoscopy.** / Every 5 years for a flexible sigmoidoscopy or every 10 years for a colonoscopy beginning at age 50 years and continuing until age 75 years.  Hepatitis C blood test.** / For all people born from 1945 through 1965 and any individual with known risks for hepatitis C.  Skin self-exam. / Monthly.  Influenza vaccine. / Every year.  Tetanus, diphtheria, and acellular pertussis (Tdap/Td) vaccine.** / Consult your health care provider. Pregnant women should receive 1 dose of Tdap vaccine during each pregnancy. 1 dose of Td every 10 years.  Varicella vaccine.** / Consult your health care provider. Pregnant females who do not have evidence of immunity should receive the first dose after pregnancy.  Zoster vaccine.** / 1 dose for adults aged 60 years or older.  Measles, mumps, rubella (MMR) vaccine.** / You need at least 1 dose of MMR if you were born in 1957 or later. You may also need a 2nd dose. For females of childbearing age, rubella immunity should be determined. If there is no evidence of immunity, females who are not pregnant should be vaccinated. If there is no evidence of immunity, females who are pregnant should delay immunization until after pregnancy.  Pneumococcal 13-valent conjugate (PCV13) vaccine.** / Consult your health care provider.  Pneumococcal polysaccharide (PPSV23) vaccine.** / 1 to 2 doses if  you smoke cigarettes or if you have certain conditions.  Meningococcal vaccine.** / Consult your health care provider.  Hepatitis A vaccine.** / Consult your health care provider.  Hepatitis B vaccine.** / Consult your health care provider.  Haemophilus influenzae type b (Hib) vaccine.** / Consult your health care provider.  Ages 65 years and over  Blood pressure check.** / Every 1 to 2 years.  Lipid and cholesterol check.** / Every 5 years beginning at age 20 years.  Lung cancer screening. / Every year if you are aged 55 80 years and have a 30-pack-year history of smoking and currently smoke or have quit within the past 15 years. Yearly screening is stopped once you have quit smoking for at least 15 years or develop a health problem that   would prevent you from having lung cancer treatment.  Clinical breast exam.** / Every year after age 103 years.  BRCA-related cancer risk assessment.** / For women who have family members with a BRCA-related cancer (breast, ovarian, tubal, or peritoneal cancers).  Mammogram.** / Every year beginning at age 36 years and continuing for as long as you are in good health. Consult with your health care provider.  Pap test.** / Every 3 years starting at age 5 years through age 85 or 10 years with 3 consecutive normal Pap tests. Testing can be stopped between 65 and 70 years with 3 consecutive normal Pap tests and no abnormal Pap or HPV tests in the past 10 years.  HPV screening.** / Every 3 years from ages 93 years through ages 70 or 45 years with a history of 3 consecutive normal Pap tests. Testing can be stopped between 65 and 70 years with 3 consecutive normal Pap tests and no abnormal Pap or HPV tests in the past 10 years.  Fecal occult blood test (FOBT) of stool. / Every year beginning at age 8 years and continuing until age 45 years. You may not need to do this test if you get a colonoscopy every 10 years.  Flexible sigmoidoscopy or colonoscopy.** /  Every 5 years for a flexible sigmoidoscopy or every 10 years for a colonoscopy beginning at age 69 years and continuing until age 68 years.  Hepatitis C blood test.** / For all people born from [birth year] through 1965 and any individual with known risks for hepatitis C.  Osteoporosis screening.** / A one-time screening for women ages 7 years and over and women at risk for fractures or osteoporosis.  Skin self-exam. / Monthly.  Influenza vaccine. / Every year.  Tetanus, diphtheria, and acellular pertussis (Tdap/Td) vaccine.** / 1 dose of Td every 10 years.  Varicella vaccine.** / Consult your health care provider.  Zoster vaccine.** / 1 dose for adults aged 5 years or older.  Pneumococcal 13-valent conjugate (PCV13) vaccine.** / Consult your health care provider.  Pneumococcal polysaccharide (PPSV23) vaccine.** / 1 dose for all adults aged 74 years and older.  Meningococcal vaccine.** / Consult your health care provider.  Hepatitis A vaccine.** / Consult your health care provider.  Hepatitis B vaccine.** / Consult your health care provider.  Haemophilus influenzae type b (Hib) vaccine.** / Consult your health care provider. ** Family history and personal history of risk and conditions may change your health care provider's recommendations. Document Released: 06/17/2001 Document Revised: 02/09/2013  [facility] Patient Information 2014 [place], [place].   EXERCISE AND DIET:  We recommended that you start or continue a regular exercise program for good health. Regular exercise means any activity that makes your heart beat faster and makes you sweat.  We recommend exercising at least 30 minutes per day at least 3 days a week, preferably 5.  We also recommend a diet low in fat and sugar / carbohydrates.  Inactivity, poor dietary choices and obesity can cause diabetes, heart attack, stroke, and kidney damage, among others.     ALCOHOL AND SMOKING:  Women should limit their alcohol intake to no  more than 7 drinks/beers/glasses of wine (combined, not each!) per week. Moderation of alcohol intake to this level decreases your risk of breast cancer and liver damage.  ( And of course, no recreational drugs are part of a healthy lifestyle.)  Also, you should not be smoking at all or even being exposed to second hand smoke. Most people know smoking can  cause cancer, and various heart and lung diseases, but did you know it also contributes to weakening of your bones?  Aging of your skin?  Yellowing of your teeth and nails?   CALCIUM AND VITAMIN D:  Adequate intake of calcium and Vitamin D are recommended.  The recommendations for exact amounts of these supplements seem to change often, but generally speaking 600 mg of calcium (either carbonate or citrate) and 800 units of Vitamin D per day seems prudent. Certain women may benefit from higher intake of Vitamin D.  If you are among these women, your doctor will have told you during your visit.     PAP SMEARS:  Pap smears, to check for cervical cancer or precancers,  have traditionally been done yearly, although recent scientific advances have shown that most women can have pap smears less often.  However, every woman still should have a physical exam from her gynecologist or primary care physician every year. It will include a breast check, inspection of the vulva and vagina to check for abnormal growths or skin changes, a visual exam of the cervix, and then an exam to evaluate the size and shape of the uterus and ovaries.  And after 49 years of age, a rectal exam is indicated to check for rectal cancers. We will also provide age appropriate advice regarding health maintenance, like when you should have certain vaccines, screening for sexually transmitted diseases, bone density testing, colonoscopy, mammograms, etc.    MAMMOGRAMS:  All women over 74 years old should have a yearly mammogram. Many facilities now offer a "3D" mammogram, which may cost  around $50 extra out of pocket. If possible,  we recommend you accept the option to have the 3D mammogram performed.  It both reduces the number of women who will be called back for extra views which then turn out to be normal, and it is better than the routine mammogram at detecting truly abnormal areas.     COLONOSCOPY:  Colonoscopy to screen for colon cancer is recommended for all women at age 37.  We know, you hate the idea of the prep.  We agree, BUT, having colon cancer and not knowing it is worse!!  Colon cancer so often starts as a polyp that can be seen and removed at colonscopy, which can quite literally save your life!  And if your first colonoscopy is normal and you have no family history of colon cancer, most women don't have to have it again for 10 years.  Once every ten years, you can do something that may end up saving your life, right?  We will be happy to help you get it scheduled when you are ready.  Be sure to check your insurance coverage so you understand how much it will cost.  It may be covered as a preventative service at no cost, but you should check your particular policy.    Continue all medications as directed. Increase water intake, strive for at least half of your body weight in ounces per day.  Follow Heart Healthy diet Increase regular exercise.  Recommend at least 30 minutes daily, 5 days per week of walking, biking, swimming, YouTube/Pinterest workout videos. We will contact you with lab results. Recommend annual physical with fasting labs. Continue to social distance and wear a mask when in public. GREAT TO SEE YOU!

## 2019-05-19 NOTE — Assessment & Plan Note (Signed)
Continue all medications as directed. Increase water intake, strive for at least half of your body weight in ounces per day.  Follow Heart Healthy diet Increase regular exercise.  Recommend at least 30 minutes daily, 5 days per week of walking, biking, swimming, YouTube/Pinterest workout videos. We will contact you with lab results. Recommend annual physical with fasting labs. Continue to social distance and wear a mask when in public.

## 2019-05-19 NOTE — Progress Notes (Signed)
Subjective:    Patient ID: Jennifer Nicholson, female    DOB: [birth year], 49 y.o.   MRN: [id number]  HPI:  Ms. Pirie is here for CPE Ms. Towles reports increase in fatigue, wt gain, and diffuse musculoskeletal pain- bil hands/hips/knees. She reports that her younger sister was dx'd with RA. She has recently resumed regular exercise- cardio dance classes twice weekly. She reports stable mood, denies SI/HI She is currently taking Escitalopram 5mg  QD. She reports reduction in night sweats. She continues to abstain from tobacco/vape use. She denies hx of migraine HA. She denies personal/family hx of clotting disorders.  Fasting labs + RA factor + Vit D drawn today  Healthcare Maintenance: PAP-UTD, 03/15/2018-normal-repeat 03/2021 Mammogram-ordered Immunizations-declined influenza vaccination today  Patient Care Team    Relationship Specialty Notifications Start End  Esaw Grandchild, NP PCP - General Family Medicine  12/07/17   Lady Gary, [facility]    12/07/17     Patient Active Problem List   Diagnosis Date Noted  . Healthcare maintenance 12/07/2017  . BMI 30.0-30.9,adult 12/07/2017  . Graves' ophthalmopathy 04/18/2013  . Attention deficit 09/02/2011  . Hypothyroidism 07/31/2011     Past Medical History:  Diagnosis Date  . Chronic kidney disease    calculi  . Thyroid disease    hypothyroidism     History reviewed. No pertinent surgical history.   Family History  Problem Relation Age of Onset  . Thyroid disease Mother   . Thyroid disease Sister   . Thyroid disease Maternal Grandmother   . Thyroid disease Sister      Social History   Substance and Sexual Activity  Drug Use No     Social History   Substance and Sexual Activity  Alcohol Use Yes  . Alcohol/week: 4.0 standard drinks  . Types: 4 Glasses of wine per week     Social History   Tobacco Use  Smoking Status Never Smoker  Smokeless Tobacco Never Used     Outpatient Encounter  Medications as of 05/19/2019  Medication Sig  . escitalopram (LEXAPRO) 5 MG tablet Take 1 tablet (5 mg total) by mouth daily.  Marland Kitchen levothyroxine (SYNTHROID, LEVOTHROID) 112 MCG tablet Take 1 tablet (112 mcg total) by mouth daily before breakfast.  . norethindrone (HEATHER) 0.35 MG tablet Take 1 tablet (0.35 mg total) by mouth daily.  . [DISCONTINUED] escitalopram (LEXAPRO) 5 MG tablet Take 1 tablet (5 mg total) by mouth daily.  . [DISCONTINUED] norethindrone (HEATHER) 0.35 MG tablet Take 1 tablet (0.35 mg total) by mouth daily.   No facility-administered encounter medications on file as of 05/19/2019.    Allergies: Hydrocodone  Body mass index is 32.45 kg/m.  Blood pressure 136/89, pulse 76, temperature 98.8 F (37.1 C), temperature source Oral, height 5' 2.5" (1.588 m), weight 180 lb 4.8 oz (81.8 kg), last menstrual period 03/01/2015, SpO2 99 %.     Review of Systems  Constitutional: Positive for fatigue. Negative for activity change, appetite change, chills, diaphoresis, fever and unexpected weight change.  HENT: Negative for congestion.   Eyes: Negative for visual disturbance.  Respiratory: Negative for cough, chest tightness, shortness of breath, wheezing and stridor.   Cardiovascular: Negative for chest pain, palpitations and leg swelling.  Gastrointestinal: Negative for abdominal distention, abdominal pain, blood in stool, constipation, diarrhea, nausea and vomiting.  Endocrine: Negative for polydipsia, polyphagia and polyuria.  Genitourinary: Negative for difficulty urinating and flank pain.  Musculoskeletal: Positive for arthralgias, joint swelling and myalgias. Negative for back pain,  gait problem, neck pain and neck stiffness.  Neurological: Negative for dizziness and headaches.  Hematological: Negative for adenopathy. Does not bruise/bleed easily.  Psychiatric/Behavioral: Negative for agitation, behavioral problems, confusion, decreased concentration, dysphoric mood,  hallucinations, self-injury, sleep disturbance and suicidal ideas. The patient is not nervous/anxious and is not hyperactive.        Objective:   Physical Exam Vitals and nursing note reviewed.  Constitutional:      General: She is not in acute distress.    Appearance: She is obese. She is not ill-appearing, toxic-appearing or diaphoretic.  HENT:     Head: Normocephalic and atraumatic.     Right Ear: Tympanic membrane, ear canal and external ear normal. There is no impacted cerumen.     Left Ear: Tympanic membrane, ear canal and external ear normal. There is no impacted cerumen.     Nose: Nose normal. No congestion.  Eyes:     Extraocular Movements: Extraocular movements intact.     Conjunctiva/sclera: Conjunctivae normal.     Pupils: Pupils are equal, round, and reactive to light.  Cardiovascular:     Rate and Rhythm: Normal rate and regular rhythm.     Pulses: Normal pulses.     Heart sounds: Normal heart sounds. No murmur. No friction rub. No gallop.   Pulmonary:     Effort: Pulmonary effort is normal. No respiratory distress.     Breath sounds: Normal breath sounds. No stridor. No wheezing, rhonchi or rales.  Chest:     Chest wall: No tenderness.  Abdominal:     General: Abdomen is protuberant. Bowel sounds are normal.     Palpations: Abdomen is soft.     Tenderness: There is no abdominal tenderness. There is no right CVA tenderness or left CVA tenderness.  Musculoskeletal:        General: Swelling and tenderness present.     Right hand: Swelling, tenderness and bony tenderness present. Normal strength. Normal capillary refill. Normal pulse.     Left hand: Swelling, tenderness and bony tenderness present. Normal strength. Normal capillary refill. Normal pulse.     Cervical back: Normal range of motion and neck supple.     Right knee: Bony tenderness present. Tenderness present.     Left knee: Bony tenderness present. Tenderness present.  Skin:    General: Skin is warm and  dry.     Capillary Refill: Capillary refill takes less than 2 seconds.  Neurological:     Mental Status: She is alert and oriented to person, place, and time.     Coordination: Coordination normal.  Psychiatric:        Mood and Affect: Mood normal.        Behavior: Behavior normal.        Thought Content: Thought content normal.        Judgment: Judgment normal.       Assessment & Plan:   1. Encounter for screening mammogram for malignant neoplasm of breast   2. Hypothyroidism, unspecified type   3. Graves' ophthalmopathy   4. Healthcare maintenance   5. Bilateral hand pain   6. Fatigue, unspecified type   7. Weight gain     Healthcare maintenance Continue all medications as directed. Increase water intake, strive for at least half of your body weight in ounces per day.  Follow Heart Healthy diet Increase regular exercise.  Recommend at least 30 minutes daily, 5 days per week of walking, biking, swimming, YouTube/Pinterest workout videos. We will contact you with  lab results. Recommend annual physical with fasting labs. Continue to social distance and wear a mask when in public.    FOLLOW-UP:  Return in about 1 year (around 05/18/2020) for CPE, Fasting Labs.

## 2019-05-20 ENCOUNTER — Telehealth: Payer: Self-pay | Admitting: Adult Health

## 2019-05-20 ENCOUNTER — Other Ambulatory Visit: Payer: Self-pay | Admitting: Adult Health

## 2019-05-20 DIAGNOSIS — Z8261 Family history of arthritis: Secondary | ICD-10-CM

## 2019-05-20 DIAGNOSIS — M79641 Pain in right hand: Secondary | ICD-10-CM

## 2019-05-20 DIAGNOSIS — M79642 Pain in left hand: Secondary | ICD-10-CM

## 2019-05-20 LAB — CBC WITH DIFFERENTIAL/PLATELET
Basophils Absolute: 0.1 10*3/uL (ref 0.0–0.2)
Basos: 1 %
EOS (ABSOLUTE): 0.1 10*3/uL (ref 0.0–0.4)
Eos: 2 %
Hematocrit: 43.2 % (ref 34.0–46.6)
Hemoglobin: 14.8 g/dL (ref 11.1–15.9)
Immature Grans (Abs): 0 10*3/uL (ref 0.0–0.1)
Immature Granulocytes: 0 %
Lymphocytes Absolute: 1.6 10*3/uL (ref 0.7–3.1)
Lymphs: 28 %
MCH: 33.4 pg — ABNORMAL HIGH (ref 26.6–33.0)
MCHC: 34.3 g/dL (ref 31.5–35.7)
MCV: 98 fL — ABNORMAL HIGH (ref 79–97)
Monocytes Absolute: 0.5 10*3/uL (ref 0.1–0.9)
Monocytes: 8 %
Neutrophils Absolute: 3.4 10*3/uL (ref 1.4–7.0)
Neutrophils: 61 %
Platelets: 181 10*3/uL (ref 150–450)
RBC: 4.43 x10E6/uL (ref 3.77–5.28)
RDW: 12.5 % (ref 11.7–15.4)
WBC: 5.6 10*3/uL (ref 3.4–10.8)

## 2019-05-20 LAB — HEMOGLOBIN A1C
Est. average glucose Bld gHb Est-mCnc: 103 mg/dL
Hgb A1c MFr Bld: 5.2 % (ref 4.8–5.6)

## 2019-05-20 LAB — LIPID PANEL
Chol/HDL Ratio: 3.5 ratio (ref 0.0–4.4)
Cholesterol, Total: 211 mg/dL — ABNORMAL HIGH (ref 100–199)
HDL: 61 mg/dL (ref >39)
LDL Chol Calc (NIH): 130 mg/dL — ABNORMAL HIGH (ref 0–99)
Triglycerides: 111 mg/dL (ref 0–149)
VLDL Cholesterol Cal: 20 mg/dL (ref 5–40)

## 2019-05-20 LAB — COMPREHENSIVE METABOLIC PANEL
ALT: 39 IU/L — ABNORMAL HIGH (ref 0–32)
AST: 35 IU/L (ref 0–40)
Albumin/Globulin Ratio: 2.1 (ref 1.2–2.2)
Albumin: 4.8 g/dL (ref 3.8–4.8)
Alkaline Phosphatase: 69 IU/L (ref 39–117)
BUN/Creatinine Ratio: 13 (ref 9–23)
BUN: 12 mg/dL (ref 6–24)
Bilirubin Total: 1 mg/dL (ref 0.0–1.2)
CO2: 24 mmol/L (ref 20–29)
Calcium: 9.4 mg/dL (ref 8.7–10.2)
Chloride: 101 mmol/L (ref 96–106)
Creatinine, Ser: 0.91 mg/dL (ref 0.57–1.00)
GFR calc Af Amer: 86 mL/min/{1.73_m2} (ref >59)
GFR calc non Af Amer: 75 mL/min/{1.73_m2} (ref >59)
Globulin, Total: 2.3 g/dL (ref 1.5–4.5)
Glucose: 92 mg/dL (ref 65–99)
Potassium: 4.4 mmol/L (ref 3.5–5.2)
Sodium: 138 mmol/L (ref 134–144)
Total Protein: 7.1 g/dL (ref 6.0–8.5)

## 2019-05-20 LAB — T4, FREE: Free T4: 1.49 ng/dL (ref 0.82–1.77)

## 2019-05-20 LAB — TSH: TSH: 3.74 u[IU]/mL (ref 0.450–4.500)

## 2019-05-20 LAB — T3: T3, Total: 106 ng/dL (ref 71–180)

## 2019-05-20 LAB — VITAMIN D 25 HYDROXY (VIT D DEFICIENCY, FRACTURES): Vit D, 25-Hydroxy: 26.2 ng/mL — ABNORMAL LOW (ref 30.0–100.0)

## 2019-05-20 LAB — RHEUMATOID FACTOR: Rheumatoid fact SerPl-aCnc: 26.3 IU/mL — ABNORMAL HIGH (ref 0.0–13.9)

## 2019-05-20 MED ORDER — LEVOTHYROXINE SODIUM 112 MCG PO TABS: 112.0000 ug | ORAL_TABLET | Freq: Every day | ORAL | 0 refills | Status: DC

## 2019-05-20 NOTE — Telephone Encounter (Signed)
Patient says Jennifer Nicholson left her a message to call office for resutlts--advised her staff leaves @ 12 noon on Fridays.  --Fyi to med asst to contact pt on Monday or any day next week@ [phone].  -glh

## 2019-05-23 NOTE — Telephone Encounter (Signed)
See additional phone encounters documentation on result notes.  Charyl Bigger, CMA

## 2019-05-30 ENCOUNTER — Telehealth: Payer: Self-pay | Admitting: Adult Health

## 2019-05-30 NOTE — Telephone Encounter (Signed)
Patient called states her appt w/ Dr. Chiquita Loth isn't under late in February & she request provider to prescribe her an pain Rx until then.  --Forwarding request to med asst for review w/ PCP & to contact patient w/decision.  --glh

## 2019-05-31 NOTE — Telephone Encounter (Signed)
Please review and advise. AS, CMA 

## 2019-05-31 NOTE — Telephone Encounter (Signed)
Good Afternoon Athena, Please have her schedule telemedicine appt to discuss. I believe she is speaking about her diffuse musculoskeletal plan, which we referred her to Rheumatologist (recent +RA Factor) Thanks! Valetta Fuller

## 2019-05-31 NOTE — Telephone Encounter (Signed)
Can you please contact patient to schedule apt as advised below per [facility]. Thank you, AS, CMA

## 2019-06-24 NOTE — Progress Notes (Signed)
Office Visit Note  Patient: Jennifer Nicholson             Date of Birth: June 29, 1970           MRN: [id number]             PCP: Esaw Grandchild, NP Referring: Esaw Grandchild, NP Visit Date: 06/27/2019 Occupation: '@GUAROCC' @  Subjective:  Pain in multiple joints.   History of Present Illness: Jennifer Nicholson is a 49 y.o. female seen in consultation per request of her PCP.  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  She is used her hands of feet and her feet a lot.  She states for the last 6 years she has been having progressively increased pain in her hands and feet.  She decided to retire from her job.  While she was at the school she was sitting for several hours of studying.  She started experiencing pain in all of her joints and muscles.  She describes pain in her cervical spine, lower back, bilateral shoulders, bilateral elbows, bilateral wrists and her hands.  She also describes discomfort in her bilateral hip joints and her feet.  She has noticed some swelling in her hands.  There is no personal history of psoriasis.  There is history of psoriasis in her sister, mother and father.  There is also family history of vitiligo.  She denies any history of Achilles tendinitis or plantar fasciitis.  There is no history of uveitis.  She plays tennis 3-4 times a week.  Activities of Daily Living:  Patient reports morning stiffness for 30-60 minutes.   Patient Reports nocturnal pain.  Difficulty dressing/grooming: Denies Difficulty climbing stairs: Denies Difficulty getting out of chair: Denies Difficulty using hands for taps, buttons, cutlery, and/or writing: Denies  Review of Systems  Constitutional: Positive for fatigue. Negative for night sweats, weight gain and weight loss.  HENT: Negative for mouth sores, trouble swallowing, trouble swallowing, mouth dryness and nose dryness.   Eyes: Positive for pain and dryness. Negative for redness and visual disturbance.    Respiratory: Negative for cough, shortness of breath, wheezing and difficulty breathing.   Cardiovascular: Negative for chest pain, palpitations, hypertension, irregular heartbeat and swelling in legs/feet.  Gastrointestinal: Negative for blood in stool, constipation and diarrhea.  Endocrine: Negative for increased urination.  Genitourinary: Negative for difficulty urinating, painful urination and vaginal dryness.  Musculoskeletal: Positive for arthralgias, joint pain, joint swelling, morning stiffness and muscle tenderness. Negative for myalgias, muscle weakness and myalgias.  Skin: Negative for color change, rash, hair loss, skin tightness, ulcers and sensitivity to sunlight.  Allergic/Immunologic: Negative for susceptible to infections.  Neurological: Negative for dizziness, numbness, headaches, memory loss, night sweats and weakness.  Hematological: Negative for bruising/bleeding tendency and swollen glands.  Psychiatric/Behavioral: Positive for sleep disturbance. Negative for depressed mood and confusion. The patient is not nervous/anxious.     PMFS History:  Patient Active Problem List   Diagnosis Date Noted  . Healthcare maintenance 12/07/2017  . BMI 30.0-30.9,adult 12/07/2017  . Graves' ophthalmopathy 04/18/2013  . Hypothyroidism 07/31/2011    Past Medical History:  Diagnosis Date  . Chronic kidney disease    calculi  . Thyroid disease    hypothyroidism    Family History  Problem Relation Age of Onset  . Thyroid disease Mother   . Hypertension Father   . Thyroid disease Sister   . Psoriasis Sister   . Thyroid disease Maternal Grandmother   .  Thyroid disease Sister   . Healthy Daughter   . Healthy Daughter    Past Surgical History:  Procedure Laterality Date  . BREAST SURGERY     Social History   Social History Narrative  . Not on file   Immunization History  Administered Date(s) Administered  . Influenza,inj,Quad PF,6+ Mos 04/18/2013  . Tdap 03/15/2018      Objective: Vital Signs: BP 127/89 (BP Location: Right Arm, Patient Position: Sitting, Cuff Size: Normal)   Pulse 70   Resp 13   Ht 5' 2.25" (1.581 m)   Wt 170 lb (77.1 kg)   LMP 03/01/2015   BMI 30.84 kg/m    Physical Exam Vitals and nursing note reviewed.  Constitutional:      Appearance: She is well-developed.  HENT:     Head: Normocephalic and atraumatic.  Eyes:     Conjunctiva/sclera: Conjunctivae normal.  Cardiovascular:     Rate and Rhythm: Normal rate and regular rhythm.     Heart sounds: Normal heart sounds.  Pulmonary:     Effort: Pulmonary effort is normal.     Breath sounds: Normal breath sounds.  Abdominal:     General: Bowel sounds are normal.     Palpations: Abdomen is soft.  Musculoskeletal:     Cervical back: Normal range of motion.  Lymphadenopathy:     Cervical: No cervical adenopathy.  Skin:    General: Skin is warm and dry.     Capillary Refill: Capillary refill takes less than 2 seconds.     Comments: No nail pitting or nail dystrophy was noted.  No psoriasis patches were noted.  Neurological:     Mental Status: She is alert and oriented to person, place, and time.  Psychiatric:        Behavior: Behavior normal.      Musculoskeletal Exam: C-spine thoracic and lumbar spine were in good range of motion.  Shoulder joints, elbow joints, wrist joints were in good range of motion.  She has DIP and PIP thickening bilaterally without any synovitis.  Mild right first flexor tendon tenosynovitis was noted without much discomfort.  She had good range of motion of bilateral hip joints, knee joints, ankle joints.  There was no evidence of plantar fasciitis or Achilles tendinitis.  Bilateral PIP and DIP thickening with no synovitis was noted.  She had tenderness over bilateral trapezius area, bilateral trochanteric bursa area and also she had few tender points.  CDAI Exam: CDAI Score: -- Patient Global: --; Provider Global: -- Swollen: --; Tender: -- Joint  Exam 06/27/2019   No joint exam has been documented for this visit   There is currently no information documented on the homunculus. Go to the Rheumatology activity and complete the homunculus joint exam.  Investigation: No additional findings.  Imaging: XR Foot 2 Views Left  Result Date: 06/27/2019 First MTP, PIP and DIP narrowing was noted.  None of  the other MTP joints showed narrowing.  No erosive changes were noted.  No intertarsal tibiotalar joint space narrowing was noted. Impression: These findings are consistent with osteoarthritis of the foot.  XR Foot 2 Views Right  Result Date: 06/27/2019 First MTP, PIP and DIP narrowing was noted.  None of  the other MTP joints showed narrowing.  No erosive changes were noted.  No intertarsal tibiotalar joint space narrowing was noted. Impression: These findings are consistent with osteoarthritis of the foot.  XR Hand 2 View Left  Result Date: 06/27/2019 PIP and DIP narrowing was noted.  No MCP, intercarpal radiocarpal joint space narrowing was noted.  No erosive changes were noted. Impression: These findings are consistent with osteoarthritis of the hand.  XR Hand 2 View Right  Result Date: 06/27/2019 PIP and DIP narrowing was noted.  No MCP, intercarpal radiocarpal joint space narrowing was noted.  No erosive changes were noted. Impression: These findings are consistent with osteoarthritis of the hand.  XR Lumbar Spine 2-3 Views  Result Date: 06/27/2019 L4-5 and L5-S1 narrowing was noted.  Anterior spurring was noted.  Facet joint arthropathy was noted. Impression: These findings are consistent with degenerative disc disease of lumbar spine and facet joint arthropathy.  XR Pelvis 1-2 Views  Result Date: 06/27/2019 No SI joint sclerosis or narrowing was noted.  No hip joint narrowing was noted. Impression: Unremarkable x-ray of the SI joints.   Recent Labs: Lab Results  Component Value Date   WBC 5.6 05/19/2019   HGB 14.8  05/19/2019   PLT 181 05/19/2019   NA 138 05/19/2019   K 4.4 05/19/2019   CL 101 05/19/2019   CO2 24 05/19/2019   GLUCOSE 92 05/19/2019   BUN 12 05/19/2019   CREATININE 0.91 05/19/2019   BILITOT 1.0 05/19/2019   ALKPHOS 69 05/19/2019   AST 35 05/19/2019   ALT 39 (H) 05/19/2019   PROT 7.1 05/19/2019   ALBUMIN 4.8 05/19/2019   CALCIUM 9.4 05/19/2019   GFRAA 86 05/19/2019    Speciality Comments: No specialty comments available.  Procedures:  No procedures performed Allergies: Hydrocodone   Assessment / Plan:     Visit Diagnoses: Bilateral hand pain -patient gives history of pain and discomfort in her bilateral hands.  She also gives history of intermittent swelling.  No synovitis was noted on the examination.  Clinical findings were consistent with osteoarthritis with DIP and PIP thickening.  She has low titer positive rheumatoid factor and positive family history of rheumatoid arthritis.  05/19/19: RF 26.3, TSH 3.74, T3 106, T4 1.49, Vitamin D 26.2 - Plan: XR Hand 2 View Right, XR Hand 2 View Left, x-ray of bilateral hands were consistent with osteoarthritis.  Sedimentation rate, Cyclic citrul peptide antibody, IgG, 14-3-3 eta Protein. Confirmatory's were discussed.  Pain in both feet -she complains of pain and discomfort in the bilateral feet.  Clinical findings are consistent with osteoarthritis.  There was no evidence of plantar fasciitis or Achilles tendinitis.  Plan: XR Foot 2 Views Right, XR Foot 2 Views Left.  X-ray of bilateral feet were consistent with osteoarthritis.  Chronic midline low back pain without sciatica -she complains of lower back pain for many years.  There is no history of radiculopathy.  Plan: XR Lumbar Spine 2-3 Views.  X-ray showed degenerative changes especially between L4-5 and L5-S1.  Facet joint arthropathy was noted.  Gait loss diet and exercise was emphasized.  Core strength exercises were discussed.  A handout on back exercises was given.  Chronic SI  joint pain -she has some discomfort over SI joint.  Plan: XR Pelvis 1-2 Views, HLA-B27 antigen.  No SI joint to sclerosis or narrowing was noted.  Trochanteric bursitis of both hips-she had tenderness over bilateral trochanteric bursa.  She also had tenderness over bilateral trapezius area and some of the other tender points.  That raises concern about possible myofascial pain.  Graves' ophthalmopathy-followed by ophthalmologist.  Hypothyroidism, unspecified type  Vitamin D deficiency-I discussed low vitamin D value and advised vitamin D 2000 units over-the-counter daily.  Family history of psoriasis in sister - And  mother per patient.  Family history of rheumatoid arthritis-per patient.  Orders: Orders Placed This Encounter  Procedures  . XR Hand 2 View Right  . XR Hand 2 View Left  . XR Foot 2 Views Right  . XR Foot 2 Views Left  . XR Lumbar Spine 2-3 Views  . XR Pelvis 1-2 Views  . Sedimentation rate  . Cyclic citrul peptide antibody, IgG  . 14-3-3 eta Protein  . HLA-B27 antigen   No orders of the defined types were placed in this encounter.   Face-to-face time spent with patient was 50 minutes. Greater than 50% of time was spent in counseling and coordination of care.  Follow-Up Instructions: Return for Joint pain.   Bo Merino, MD  Note - This record has been created using [employer].  Chart creation errors have been sought, but may not always  have been located. Such creation errors do not reflect on  the standard of medical care.

## 2019-06-27 ENCOUNTER — Ambulatory Visit: Payer: Self-pay

## 2019-06-27 ENCOUNTER — Ambulatory Visit: Payer: 59 | Admitting: Rheumatology

## 2019-06-27 ENCOUNTER — Other Ambulatory Visit: Payer: Self-pay

## 2019-06-27 ENCOUNTER — Encounter: Payer: Self-pay | Admitting: Rheumatology

## 2019-06-27 VITALS — BP 127/89 | HR 70 | Resp 13 | Ht 62.25 in | Wt 170.0 lb

## 2019-06-27 DIAGNOSIS — M79672 Pain in left foot: Secondary | ICD-10-CM | POA: Diagnosis not present

## 2019-06-27 DIAGNOSIS — M533 Sacrococcygeal disorders, not elsewhere classified: Secondary | ICD-10-CM | POA: Diagnosis not present

## 2019-06-27 DIAGNOSIS — E0500 Thyrotoxicosis with diffuse goiter without thyrotoxic crisis or storm: Secondary | ICD-10-CM

## 2019-06-27 DIAGNOSIS — M79641 Pain in right hand: Secondary | ICD-10-CM | POA: Diagnosis not present

## 2019-06-27 DIAGNOSIS — E559 Vitamin D deficiency, unspecified: Secondary | ICD-10-CM

## 2019-06-27 DIAGNOSIS — G8929 Other chronic pain: Secondary | ICD-10-CM

## 2019-06-27 DIAGNOSIS — M545 Low back pain: Secondary | ICD-10-CM | POA: Diagnosis not present

## 2019-06-27 DIAGNOSIS — M79671 Pain in right foot: Secondary | ICD-10-CM | POA: Diagnosis not present

## 2019-06-27 DIAGNOSIS — Z840 Family history of diseases of the skin and subcutaneous tissue: Secondary | ICD-10-CM

## 2019-06-27 DIAGNOSIS — M7062 Trochanteric bursitis, left hip: Secondary | ICD-10-CM

## 2019-06-27 DIAGNOSIS — M79642 Pain in left hand: Secondary | ICD-10-CM | POA: Diagnosis not present

## 2019-06-27 DIAGNOSIS — M7061 Trochanteric bursitis, right hip: Secondary | ICD-10-CM

## 2019-06-27 DIAGNOSIS — Z8261 Family history of arthritis: Secondary | ICD-10-CM

## 2019-06-27 DIAGNOSIS — E039 Hypothyroidism, unspecified: Secondary | ICD-10-CM

## 2019-06-27 NOTE — Patient Instructions (Signed)

## 2019-07-04 LAB — HLA-B27 ANTIGEN: HLA-B27 Antigen: NEGATIVE

## 2019-07-04 LAB — CYCLIC CITRUL PEPTIDE ANTIBODY, IGG: Cyclic Citrullin Peptide Ab: <16 UNITS

## 2019-07-04 LAB — SEDIMENTATION RATE: Sed Rate: 2 mm/h (ref 0–20)

## 2019-07-04 LAB — 14-3-3 ETA PROTEIN: 14-3-3 eta Protein: <0.2 ng/mL (ref <0.2)

## 2019-07-05 NOTE — Progress Notes (Signed)
Labs are within normal limits.  Which include sed rate, anti-CCP, '14 3 3 ' eta and HLA-B27.  We will discuss results at the follow-up visit.

## 2019-07-15 NOTE — Progress Notes (Signed)
Office Visit Note  Patient: Jennifer Nicholson             Date of Birth: October 19, 1970           MRN: [id number]             PCP: Esaw Grandchild, NP Referring: Esaw Grandchild, NP Visit Date: 07/20/2019 Occupation: '@GUAROCC'$ @  Subjective:  Discussed lab work  History of Present Illness: Jennifer Nicholson is a 49 y.o. female with history of osteoarthritis and DDD.  Patient presents today with ongoing pain in her lower back.  She describes the pain is bandlike and a deep ache.  She denies any symptoms of radiculopathy.  She denies any alleviating factors.  She has started to take turmeric and tart cherry but has not noticed any improvement.  She has started playing tennis on a regular basis which has also been aggravating her lower back.  She continues to have pain and stiffness in both hands.  She denies any pain in her feet at this time.   Activities of Daily Living:  Patient reports morning stiffness for 30 minutes.   Patient Reports nocturnal pain.  Difficulty dressing/grooming: Denies Difficulty climbing stairs: Denies Difficulty getting out of chair: Denies Difficulty using hands for taps, buttons, cutlery, and/or writing: Reports  Review of Systems  Constitutional: Positive for fatigue.  HENT: Positive for mouth dryness. Negative for mouth sores and nose dryness.   Eyes: Positive for dryness. Negative for pain and visual disturbance.  Respiratory: Negative for cough, hemoptysis, shortness of breath and difficulty breathing.   Cardiovascular: Positive for swelling in legs/feet. Negative for chest pain, palpitations and hypertension.  Gastrointestinal: Positive for constipation. Negative for blood in stool and diarrhea.  Endocrine: Negative for increased urination.  Genitourinary: Negative for difficulty urinating and painful urination.  Musculoskeletal: Positive for arthralgias, joint pain, joint swelling, muscle weakness, morning stiffness and muscle tenderness. Negative for myalgias  and myalgias.  Skin: Negative for color change, pallor, rash, hair loss, nodules/bumps, skin tightness, ulcers and sensitivity to sunlight.  Allergic/Immunologic: Negative for susceptible to infections.  Neurological: Negative for dizziness, headaches and weakness.  Hematological: Negative for swollen glands.  Psychiatric/Behavioral: Positive for sleep disturbance. Negative for depressed mood. The patient is not nervous/anxious.     PMFS History:  Patient Active Problem List   Diagnosis Date Noted  . Healthcare maintenance 12/07/2017  . BMI 30.0-30.9,adult 12/07/2017  . Graves' ophthalmopathy 04/18/2013  . Hypothyroidism 07/31/2011    Past Medical History:  Diagnosis Date  . Chronic kidney disease    calculi  . Kidney stone   . Thyroid disease    hypothyroidism    Family History  Problem Relation Age of Onset  . Thyroid disease Mother   . Hypertension Father   . Thyroid disease Sister   . Psoriasis Sister   . Thyroid disease Maternal Grandmother   . Thyroid disease Sister   . Healthy Daughter   . Healthy Daughter    Past Surgical History:  Procedure Laterality Date  . BREAST SURGERY     Social History   Social History Narrative  . Not on file   Immunization History  Administered Date(s) Administered  . Influenza,inj,Quad PF,6+ Mos 04/18/2013  . Tdap 03/15/2018     Objective: Vital Signs: BP 104/65 (BP Location: Left Arm, Patient Position: Sitting, Cuff Size: Normal)   Pulse 70   Resp 14   Ht '5\' 2"'$  (1.575 m)   Wt 165 lb (74.8 kg)  LMP 03/01/2015   BMI 30.18 kg/m    Physical Exam Vitals and nursing note reviewed.  Constitutional:      Appearance: She is well-developed.  HENT:     Head: Normocephalic and atraumatic.  Eyes:     Conjunctiva/sclera: Conjunctivae normal.  Pulmonary:     Effort: Pulmonary effort is normal.  Abdominal:     General: Bowel sounds are normal.     Palpations: Abdomen is soft.  Musculoskeletal:     Cervical back: Normal  range of motion.  Lymphadenopathy:     Cervical: No cervical adenopathy.  Skin:    General: Skin is warm and dry.     Capillary Refill: Capillary refill takes less than 2 seconds.  Neurological:     Mental Status: She is alert and oriented to person, place, and time.  Psychiatric:        Behavior: Behavior normal.      Musculoskeletal Exam: C-spine has good range of motion with discomfort.  Lumbar spine has good range of motion with discomfort.  She has midline spinal tenderness.  Tenderness over bilateral SI joints.  Shoulder joints, elbow joints, wrist joints, MCPs, PIPs, DIPs good range of motion with no synovitis.  She has complete fist formation bilaterally.  She has PIP and DIP thickening consistent with osteoarthritis of both hands.  She has complete fist formation bilaterally.  Hip joints, knee joints, ankle joints, MTPs, PIPs, and DIPs good ROM with no synovitis.  No warmth or effusion of knee joints. No tenderness or swelling of ankle joints.   CDAI Exam: CDAI Score: -- Patient Global: --; Provider Global: -- Swollen: --; Tender: -- Joint Exam 07/20/2019   No joint exam has been documented for this visit   There is currently no information documented on the homunculus. Go to the Rheumatology activity and complete the homunculus joint exam.  Investigation: No additional findings.  Imaging: XR Foot 2 Views Left  Result Date: 06/27/2019 First MTP, PIP and DIP narrowing was noted.  None of  the other MTP joints showed narrowing.  No erosive changes were noted.  No intertarsal tibiotalar joint space narrowing was noted. Impression: These findings are consistent with osteoarthritis of the foot.  XR Foot 2 Views Right  Result Date: 06/27/2019 First MTP, PIP and DIP narrowing was noted.  None of  the other MTP joints showed narrowing.  No erosive changes were noted.  No intertarsal tibiotalar joint space narrowing was noted. Impression: These findings are consistent with  osteoarthritis of the foot.  XR Hand 2 View Left  Result Date: 06/27/2019 PIP and DIP narrowing was noted.  No MCP, intercarpal radiocarpal joint space narrowing was noted.  No erosive changes were noted. Impression: These findings are consistent with osteoarthritis of the hand.  XR Hand 2 View Right  Result Date: 06/27/2019 PIP and DIP narrowing was noted.  No MCP, intercarpal radiocarpal joint space narrowing was noted.  No erosive changes were noted. Impression: These findings are consistent with osteoarthritis of the hand.  XR Lumbar Spine 2-3 Views  Result Date: 06/27/2019 L4-5 and L5-S1 narrowing was noted.  Anterior spurring was noted.  Facet joint arthropathy was noted. Impression: These findings are consistent with degenerative disc disease of lumbar spine and facet joint arthropathy.  XR Pelvis 1-2 Views  Result Date: 06/27/2019 No SI joint sclerosis or narrowing was noted.  No hip joint narrowing was noted. Impression: Unremarkable x-ray of the SI joints.   Recent Labs: Lab Results  Component Value Date  WBC 5.6 05/19/2019   HGB 14.8 05/19/2019   PLT 181 05/19/2019   NA 138 05/19/2019   K 4.4 05/19/2019   CL 101 05/19/2019   CO2 24 05/19/2019   GLUCOSE 92 05/19/2019   BUN 12 05/19/2019   CREATININE 0.91 05/19/2019   BILITOT 1.0 05/19/2019   ALKPHOS 69 05/19/2019   AST 35 05/19/2019   ALT 39 (H) 05/19/2019   PROT 7.1 05/19/2019   ALBUMIN 4.8 05/19/2019   CALCIUM 9.4 05/19/2019   GFRAA 86 05/19/2019  June 27, 2019 ESR 2, anti-CCP negative, _0 eta negative, HLA-B27 negative  Speciality Comments: No specialty comments available.  Procedures:  No procedures performed Allergies: Hydrocodone   Assessment / Plan:     Visit Diagnoses: Primary osteoarthritis of both hands - Clinical and radiographic findings were consistent with osteoarthritis.  Rheumatoid factor was 26 on 05/19/2019.  Anti-CCP negative.  _1 eta negative.  Sed rate 2 on 06/07/2019: She  continues to have pain and stiffness in both hands.  She has no synovitis on exam today.  She has complete fist formation bilaterally.  She has PIP and DIP thickening consistent with osteoarthritis of both hands.  She does have a known family history of rheumatoid arthritis as well as psoriatic arthritis but she has no clinical or radiographic evidence of these conditions.  She was advised to notify us if she develops increased joint pain or joint swelling.  We discussed scheduling an ultrasound in both hands if she develops new or worsening symptoms. she will follow up in 6 months.  Primary osteoarthritis of both feet: X-rays of both feet on 06/07/2019 were consistent with osteoarthritis.  She has no discomfort in her feet at this time.  She has good range of motion of both ankle joints.  No tenderness or inflammation was noted.  DDD (degenerative disc disease), lumbar - And facet joint arthropathy: She has chronic lower back pain.  She describes the pain is constant and bandlike.  She has midline spinal tenderness in the lumbar region.  She has not had any symptoms of radiculopathy.  She has not found any alleviating factors.  X-rays of the lumbar spine were obtained on 06/26/2018 and were consistent with degenerative disc and were consistent with DDD and facet joint arthropathy.  We will refer her to Dr. Ernestina Patches for further evaluation and treatment.   Chronic SI joint pain - SI joint x-rays were unremarkable.  She has SI joint tenderness bilaterally.    Trochanteric bursitis of both hips: She has intermittent discomfort bilaterally.   Other medical conditions are listed as follows:   Vitamin D deficiency  Hypothyroidism, unspecified type  Graves' ophthalmopathy  Family history of psoriasis in sister  Family history of rheumatoid arthritis  Orders: Orders Placed This Encounter  Procedures  . Ambulatory referral to Physical Medicine Rehab   No orders of the defined types were placed in this  encounter.     Follow-Up Instructions: Return in about 6 months (around 01/20/2020) for Osteoarthritis, DDD.   Hazel Sams, PA-C  I examined and evaluated the patient with Hazel Sams PA.  Patient continues to have some discomfort from underlying osteoarthritis.  No synovitis was noted on the examination.  All autoimmune work-up has been negative.  Advised to contact us in case she has increased symptoms.  She has been also having increased discomfort from facet joint arthropathy.  We will refer her to Dr. Ernestina Patches.  The plan of care was discussed as noted  above.  Bo Merino, MD    Note - This record has been created using [employer].  Chart creation errors have been sought, but may not always  have been located. Such creation errors do not reflect on  the standard of medical care.

## 2019-07-20 ENCOUNTER — Encounter (INDEPENDENT_AMBULATORY_CARE_PROVIDER_SITE_OTHER): Payer: Self-pay

## 2019-07-20 ENCOUNTER — Other Ambulatory Visit: Payer: Self-pay

## 2019-07-20 ENCOUNTER — Ambulatory Visit: Payer: 59 | Admitting: Rheumatology

## 2019-07-20 ENCOUNTER — Encounter: Payer: Self-pay | Admitting: Rheumatology

## 2019-07-20 VITALS — BP 104/65 | HR 70 | Resp 14 | Ht 62.0 in | Wt 165.0 lb

## 2019-07-20 DIAGNOSIS — M19041 Primary osteoarthritis, right hand: Secondary | ICD-10-CM

## 2019-07-20 DIAGNOSIS — M19071 Primary osteoarthritis, right ankle and foot: Secondary | ICD-10-CM | POA: Diagnosis not present

## 2019-07-20 DIAGNOSIS — M5136 Other intervertebral disc degeneration, lumbar region: Secondary | ICD-10-CM | POA: Diagnosis not present

## 2019-07-20 DIAGNOSIS — E559 Vitamin D deficiency, unspecified: Secondary | ICD-10-CM

## 2019-07-20 DIAGNOSIS — Z840 Family history of diseases of the skin and subcutaneous tissue: Secondary | ICD-10-CM

## 2019-07-20 DIAGNOSIS — Z8261 Family history of arthritis: Secondary | ICD-10-CM

## 2019-07-20 DIAGNOSIS — M19072 Primary osteoarthritis, left ankle and foot: Secondary | ICD-10-CM

## 2019-07-20 DIAGNOSIS — M533 Sacrococcygeal disorders, not elsewhere classified: Secondary | ICD-10-CM

## 2019-07-20 DIAGNOSIS — E0500 Thyrotoxicosis with diffuse goiter without thyrotoxic crisis or storm: Secondary | ICD-10-CM

## 2019-07-20 DIAGNOSIS — G8929 Other chronic pain: Secondary | ICD-10-CM

## 2019-07-20 DIAGNOSIS — M7061 Trochanteric bursitis, right hip: Secondary | ICD-10-CM

## 2019-07-20 DIAGNOSIS — M7062 Trochanteric bursitis, left hip: Secondary | ICD-10-CM

## 2019-07-20 DIAGNOSIS — E039 Hypothyroidism, unspecified: Secondary | ICD-10-CM

## 2019-07-20 DIAGNOSIS — M19042 Primary osteoarthritis, left hand: Secondary | ICD-10-CM

## 2019-08-20 ENCOUNTER — Other Ambulatory Visit: Payer: Self-pay

## 2019-08-20 ENCOUNTER — Encounter: Payer: Self-pay | Admitting: *Deleted

## 2019-08-20 ENCOUNTER — Ambulatory Visit
Admission: EM | Admit: 2019-08-20 | Discharge: 2019-08-20 | Disposition: A | Discharge status: Home / Self Care | Payer: 59 | Attending: Physician Assistant | Admitting: Physician Assistant

## 2019-08-20 DIAGNOSIS — Z20822 Contact with and (suspected) exposure to covid-19: Secondary | ICD-10-CM

## 2019-08-20 DIAGNOSIS — R05 Cough: Secondary | ICD-10-CM | POA: Diagnosis not present

## 2019-08-20 DIAGNOSIS — R430 Anosmia: Secondary | ICD-10-CM

## 2019-08-20 DIAGNOSIS — R059 Cough, unspecified: Secondary | ICD-10-CM

## 2019-08-20 MED ORDER — PREDNISONE 50 MG PO TABS: 50.0000 mg | ORAL_TABLET | Freq: Every day | ORAL | 0 refills | Status: DC

## 2019-08-20 MED ORDER — FLUTICASONE PROPIONATE 50 MCG/ACT NA SUSP: 2.0000 | Freq: Every day | NASAL | 0 refills | Status: DC

## 2019-08-20 MED ORDER — AZELASTINE HCL 0.1 % NA SOLN: 2.0000 | Freq: Two times a day (BID) | NASAL | 0 refills | Status: DC

## 2019-08-20 NOTE — ED Triage Notes (Signed)
C/O extreme fatigue over past 4 days; shortly after onset, started with nasal congestion, burning nose, loss of smell, SOB, cough if she tries to deep breath.  Denies any fevers.

## 2019-08-20 NOTE — ED Provider Notes (Signed)
[facility]    CSN: [id number] Arrival date & time: 08/20/19  1324      History   Chief Complaint Chief Complaint  Patient presents with  . Fatigue  . Nasal Congestion    HPI Jennifer Nicholson is a 49 y.o. female.   49 year old female with history of hypothyroidism comes in for 4 day of COVID like symptoms. Has had fatigue, nasal congestion, loss of smell. Shortness of breath stating due to unable to take a deep breath as she will cough. Denies fever, chills, body aches. Denies abdominal pain, nausea, vomiting, diarrhea. Has had some dizziness/lightheadedness. No medications at this time.   Never smoker-- passive smoker as a child. Denies history of asthma.      Past Medical History:  Diagnosis Date  . Chronic kidney disease    calculi  . Kidney stone   . Thyroid disease    hypothyroidism    Patient Active Problem List   Diagnosis Date Noted  . Healthcare maintenance 12/07/2017  . BMI 30.0-30.9,adult 12/07/2017  . Graves' ophthalmopathy 04/18/2013  . Hypothyroidism 07/31/2011    Past Surgical History:  Procedure Laterality Date  . BREAST SURGERY      OB History   No obstetric history on file.      Home Medications    Prior to Admission medications   Medication Sig Start Date End Date Taking? Authorizing Provider  escitalopram (LEXAPRO) 5 MG tablet Take 1 tablet (5 mg total) by mouth daily. 05/19/19  Yes Danford, Valetta Fuller D, NP  levothyroxine (SYNTHROID) 112 MCG tablet Take 1 tablet (112 mcg total) by mouth daily before breakfast. 05/20/19  Yes Danford, Valetta Fuller D, NP  norethindrone (HEATHER) 0.35 MG tablet Take 1 tablet (0.35 mg total) by mouth daily. 05/19/19  Yes Danford, Valetta Fuller D, NP  azelastine (ASTELIN) 0.1 % nasal spray Place 2 sprays into both nostrils 2 (two) times daily. 08/20/19   Tasia Catchings, Mumtaz Lovins V, PA-C  fluticasone (FLONASE) 50 MCG/ACT nasal spray Place 2 sprays into both nostrils daily. 08/20/19   Ok Edwards, PA-C  predniSONE (DELTASONE) 50 MG tablet  Take 1 tablet (50 mg total) by mouth daily with breakfast. 08/20/19   Ok Edwards, PA-C    Family History Family History  Problem Relation Age of Onset  . Thyroid disease Mother   . Hypertension Father   . Thyroid disease Sister   . Psoriasis Sister   . Thyroid disease Maternal Grandmother   . Thyroid disease Sister   . Healthy Daughter   . Healthy Daughter     Social History Social History   Tobacco Use  . Smoking status: Never Smoker  . Smokeless tobacco: Never Used  Substance Use Topics  . Alcohol use: Yes    Comment: occasional  . Drug use: No     Allergies   Hydrocodone   Review of Systems Review of Systems  Reason unable to perform ROS: See HPI as above.     Physical Exam Triage Vital Signs ED Triage Vitals  Enc Vitals Group     BP 08/20/19 1334 126/85     Pulse Rate 08/20/19 1334 82     Resp 08/20/19 1334 16     Temp 08/20/19 1334 97.6 F (36.4 C)     Temp Source 08/20/19 1334 Oral     SpO2 08/20/19 1334 95 %     Weight --      Height --      Head Circumference --  Peak Flow --      Pain Score 08/20/19 1340 0     Pain Loc --      Pain Edu? --      Excl. in [place]? --    No data found.  Updated Vital Signs BP 126/85 (BP Location: Left Arm)   Pulse 82   Temp 97.6 F (36.4 C) (Oral)   Resp 16   LMP 03/01/2015   SpO2 95%   Physical Exam Constitutional:      General: She is not in acute distress.    Appearance: Normal appearance. She is not ill-appearing, toxic-appearing or diaphoretic.  HENT:     Head: Normocephalic and atraumatic.     Nose:     Right Sinus: Maxillary sinus tenderness and frontal sinus tenderness present.     Left Sinus: Maxillary sinus tenderness and frontal sinus tenderness present.     Mouth/Throat:     Mouth: Mucous membranes are moist.     Pharynx: Oropharynx is clear. Uvula midline.  Cardiovascular:     Rate and Rhythm: Normal rate and regular rhythm.     Heart sounds: Normal heart sounds. No murmur. No  friction rub. No gallop.   Pulmonary:     Effort: Pulmonary effort is normal. No accessory muscle usage, prolonged expiration, respiratory distress or retractions.     Comments: Lungs clear to auscultation without adventitious lung sounds. Musculoskeletal:     Cervical back: Normal range of motion and neck supple.  Neurological:     General: No focal deficit present.     Mental Status: She is alert and oriented to person, place, and time.      UC Treatments / Results  Labs (all labs ordered are listed, but only abnormal results are displayed) Labs Reviewed  NOVEL CORONAVIRUS, NAA    EKG   Radiology No results found.  Procedures Procedures (including critical care time)  Medications Ordered in UC Medications - No data to display  Initial Impression / Assessment and Plan / UC Course  I have reviewed the triage vital signs and the nursing notes.  Pertinent labs & imaging results that were available during my care of the patient were reviewed by me and considered in my medical decision making (see chart for details).    COVID PCR test ordered. Patient to quarantine until testing results return. No alarming signs on exam.  Patient speaking in full sentences without respiratory distress. Possible reactive airway contributing to symptoms, start prednisone as directed. Other symptomatic treatment discussed.  Push fluids.  Return precautions given.  Patient expresses understanding and agrees to plan.  Final Clinical Impressions(s) / UC Diagnoses   Final diagnoses:  Cough  Loss of smell    ED Prescriptions    Medication Sig Dispense Auth. Provider   predniSONE (DELTASONE) 50 MG tablet Take 1 tablet (50 mg total) by mouth daily with breakfast. 5 tablet Harshal Sirmon V, PA-C   fluticasone (FLONASE) 50 MCG/ACT nasal spray Place 2 sprays into both nostrils daily. 1 g Rowene Suto V, PA-C   azelastine (ASTELIN) 0.1 % nasal spray Place 2 sprays into both nostrils 2 (two) times daily. 30 mL  Ok Edwards, PA-C     PDMP not reviewed this encounter.   Ok Edwards, PA-C 08/20/19 1427

## 2019-08-20 NOTE — Discharge Instructions (Signed)
COVID PCR testing ordered. I would like you to quarantine until testing results. Prednisone as directed for reactive airway/bronchitis like symptoms. Flonase, azelastine as directed. Tylenol/motrin for pain and fever. Keep hydrated, urine should be clear to pale yellow in color. If experiencing shortness of breath, trouble breathing, go to the emergency department for further evaluation needed.

## 2019-08-21 LAB — NOVEL CORONAVIRUS, NAA: SARS-CoV-2, NAA: DETECTED — AB

## 2019-08-21 LAB — SARS-COV-2, NAA 2 DAY TAT

## 2019-11-23 ENCOUNTER — Other Ambulatory Visit: Payer: Self-pay | Admitting: Adult Health

## 2019-11-23 ENCOUNTER — Other Ambulatory Visit: Payer: Self-pay | Admitting: Physician Assistant

## 2020-01-10 NOTE — Progress Notes (Deleted)
Office Visit Note  Patient: Jennifer Nicholson             Date of Birth: 04-13-1971           MRN: [id number]             PCP: No primary care provider on file. Referring: Esaw Grandchild, NP Visit Date: 01/24/2020 Occupation: @GUAROCC @  Subjective:  No chief complaint on file.   History of Present Illness: Jennifer Nicholson is a 49 y.o. female ***   Activities of Daily Living:  Patient reports morning stiffness for *** {minute/hour:19697}.   Patient {ACTIONS;DENIES/REPORTS:21021675::"Denies"} nocturnal pain.  Difficulty dressing/grooming: {ACTIONS;DENIES/REPORTS:21021675::"Denies"} Difficulty climbing stairs: {ACTIONS;DENIES/REPORTS:21021675::"Denies"} Difficulty getting out of chair: {ACTIONS;DENIES/REPORTS:21021675::"Denies"} Difficulty using hands for taps, buttons, cutlery, and/or writing: {ACTIONS;DENIES/REPORTS:21021675::"Denies"}  No Rheumatology ROS completed.   PMFS History:  Patient Active Problem List   Diagnosis Date Noted  . Healthcare maintenance 12/07/2017  . BMI 30.0-30.9,adult 12/07/2017  . Graves' ophthalmopathy 04/18/2013  . Hypothyroidism 07/31/2011    Past Medical History:  Diagnosis Date  . Chronic kidney disease    calculi  . Kidney stone   . Thyroid disease    hypothyroidism    Family History  Problem Relation Age of Onset  . Thyroid disease Mother   . Hypertension Father   . Thyroid disease Sister   . Psoriasis Sister   . Thyroid disease Maternal Grandmother   . Thyroid disease Sister   . Healthy Daughter   . Healthy Daughter    Past Surgical History:  Procedure Laterality Date  . BREAST SURGERY     Social History   Social History Narrative  . Not on file   Immunization History  Administered Date(s) Administered  . Influenza,inj,Quad PF,6+ Mos 04/18/2013  . Tdap 03/15/2018     Objective: Vital Signs: LMP 03/01/2015    Physical Exam   Musculoskeletal Exam: ***  CDAI Exam: CDAI Score: -- Patient Global: --; Provider  Global: -- Swollen: --; Tender: -- Joint Exam 01/24/2020   No joint exam has been documented for this visit   There is currently no information documented on the homunculus. Go to the Rheumatology activity and complete the homunculus joint exam.  Investigation: No additional findings.  Imaging: No results found.  Recent Labs: Lab Results  Component Value Date   WBC 5.6 05/19/2019   HGB 14.8 05/19/2019   PLT 181 05/19/2019   NA 138 05/19/2019   K 4.4 05/19/2019   CL 101 05/19/2019   CO2 24 05/19/2019   GLUCOSE 92 05/19/2019   BUN 12 05/19/2019   CREATININE 0.91 05/19/2019   BILITOT 1.0 05/19/2019   ALKPHOS 69 05/19/2019   AST 35 05/19/2019   ALT 39 (H) 05/19/2019   PROT 7.1 05/19/2019   ALBUMIN 4.8 05/19/2019   CALCIUM 9.4 05/19/2019   GFRAA 86 05/19/2019    Speciality Comments: No specialty comments available.  Procedures:  No procedures performed Allergies: Hydrocodone   Assessment / Plan:     Visit Diagnoses: Primary osteoarthritis of both hands  Primary osteoarthritis of both feet  DDD (degenerative disc disease), lumbar  Chronic SI joint pain  Trochanteric bursitis of both hips  Vitamin D deficiency  Graves' ophthalmopathy  Family history of psoriasis in sister  Family history of rheumatoid arthritis  Orders: No orders of the defined types were placed in this encounter.  No orders of the defined types were placed in this encounter.   Face-to-face time spent with patient was *** minutes. Greater than  50% of time was spent in counseling and coordination of care.  Follow-Up Instructions: No follow-ups on file.   Ofilia Neas, PA-C  Note - This record has been created using Dragon software.  Chart creation errors have been sought, but may not always  have been located. Such creation errors do not reflect on  the standard of medical care.

## 2020-01-24 ENCOUNTER — Ambulatory Visit: Payer: 59 | Admitting: Rheumatology

## 2020-04-05 ENCOUNTER — Telehealth: Payer: Self-pay | Admitting: Physician Assistant

## 2020-04-05 MED ORDER — SYNTHROID 112 MCG PO TABS: 112.0000 ug | ORAL_TABLET | Freq: Every day | ORAL | 0 refills | Status: DC

## 2020-04-05 NOTE — Telephone Encounter (Signed)
Refill sent to pharmacy. AS, CMA 

## 2020-04-05 NOTE — Telephone Encounter (Signed)
Patient needs refill on Synthroid.  Patient has appt w/Charley on 04/17/20 @ 10:45am.   SYNTHROID 112 MCG tablet [[id number]]   Order Details Dose: 112 mcg Route: Oral Frequency: Daily before breakfast  Dispense Quantity: 60 tablet Refills: 0       Sig: Take 1 tablet (112 mcg total) by mouth daily before breakfast. **PATIENT NEEDS APT FOR FURTHER REFILLS**      Start Date: 12/06/19 End Date: --  Written Date: 12/06/19 Expiration Date: 12/05/20  Original Order:  levothyroxine (SYNTHROID) 112 MCG tablet [[id number]]

## 2020-04-16 NOTE — Progress Notes (Addendum)
Established Patient Office Visit  Subjective:  Patient ID: Jennifer Nicholson, female    DOB: [birth year]  Age: 49 y.o. MRN: [id number]  CC:  Chief Complaint  Patient presents with  . Hypothyroidism    HPI Jennifer Nicholson presents for medication refill  Hashimoto's Hypothyroidism: diagnosed at least 25 years ago. Has been taking Levothyroxine 112 mcg. Still has thyroid - no hx of ablation/radioactive iodine. Denies choking/dysphagia/odynophagia/voice change. Denies personal or family hx of thyroid cancer. Sister/mother have autoimmune thyroid disease Followed by Optho. for eye disease - states she had bad Lasik and chronic dry eye/irritation Lab Results  Component Value Date   TSH 3.740 05/19/2019    Long-term OCP use: on POP therapy (heather) for hot flashes about 10 years. Reports she has been in premature menopause since mid-30's. She cannot take estrogen.   She is also taking Lexapro 5 mg for hot flashes. She states hot flashes well controlled with current meds ,but still has some occasionally. Denies drenching nightsweats LMP was 3-4 years ago. Denies AUB.    Depression screen [facility] 2/9 04/17/2020 05/19/2019 03/15/2018 12/07/2017  Decreased Interest 0 0 0 0  Down, Depressed, Hopeless 0 0 0 0  PHQ - 2 Score 0 0 0 0  Altered sleeping 0 1 0 0  Tired, decreased energy 0 1 0 0  Change in appetite 0 0 0 0  Feeling bad or failure about yourself  0 0 0 0  Trouble concentrating 0 0 0 0  Moving slowly or fidgety/restless 0 0 0 0  Suicidal thoughts 0 0 0 0  PHQ-9 Score 0 2 0 0  Difficult doing work/chores - Not difficult at all Not difficult at all -    Past Medical History:  Diagnosis Date  . Chronic kidney disease    calculi  . Kidney stone   . Thyroid disease    hypothyroidism    Past Surgical History:  Procedure Laterality Date  . BREAST SURGERY      Family History  Problem Relation Age of Onset  . Thyroid disease Mother   . Hypertension Father   . Thyroid disease  Sister   . Psoriasis Sister   . Thyroid disease Maternal Grandmother   . Thyroid disease Sister   . Healthy Daughter   . Healthy Daughter     Social History   Socioeconomic History  . Marital status: Single    Spouse name: Not on file  . Number of children: Not on file  . Years of education: Not on file  . Highest education level: Not on file  Occupational History  . Not on file  Tobacco Use  . Smoking status: Never Smoker  . Smokeless tobacco: Never Used  Vaping Use  . Vaping Use: Never used  Substance and Sexual Activity  . Alcohol use: Yes    Comment: occasional  . Drug use: No  . Sexual activity: Yes  Other Topics Concern  . Not on file  Social History Narrative  . Not on file   Social Determinants of Health   Financial Resource Strain: Not on file  Food Insecurity: Not on file  Transportation Needs: Not on file  Physical Activity: Not on file  Stress: Not on file  Social Connections: Not on file  Intimate Partner Violence: Not on file    Outpatient Medications Prior to Visit  Medication Sig Dispense Refill  . escitalopram (LEXAPRO) 5 MG tablet TAKE 1 TABLET BY MOUTH DAILY. 60 tablet 0  .  norethindrone (HEATHER) 0.35 MG tablet Take 1 tablet (0.35 mg total) by mouth daily. 1 Package 11  . predniSONE (DELTASONE) 50 MG tablet Take 1 tablet (50 mg total) by mouth daily with breakfast. 5 tablet 0  . SYNTHROID 112 MCG tablet Take 1 tablet (112 mcg total) by mouth daily before breakfast. **PATIENT NEEDS APT FOR FURTHER REFILLS** 30 tablet 0  . azelastine (ASTELIN) 0.1 % nasal spray Place 2 sprays into both nostrils 2 (two) times daily. 30 mL 0  . fluticasone (FLONASE) 50 MCG/ACT nasal spray Place 2 sprays into both nostrils daily. 1 g 0   No facility-administered medications prior to visit.    Allergies  Allergen Reactions  . Hydrocodone Itching    ROS Review of Systems  Constitutional: Negative.   HENT: Negative for trouble swallowing and voice change.    Cardiovascular: Negative for chest pain and palpitations.  Endocrine: Negative.   Psychiatric/Behavioral: Negative.   All other systems reviewed and are negative.     Objective:    Physical Exam Vitals reviewed.  Constitutional:      Appearance: Normal appearance.  HENT:     Head: Normocephalic and atraumatic.  Neck:     Thyroid: No thyroid mass, thyromegaly or thyroid tenderness.     Trachea: Trachea normal.  Cardiovascular:     Rate and Rhythm: Normal rate and regular rhythm.     Heart sounds: Normal heart sounds.  Pulmonary:     Effort: Pulmonary effort is normal.     Breath sounds: Normal breath sounds.  Musculoskeletal:     Cervical back: Full passive range of motion without pain.  Neurological:     Mental Status: She is alert.  Psychiatric:        Attention and Perception: Perception normal.        Mood and Affect: Mood and affect normal.        Speech: Speech normal.        Behavior: Behavior normal.        Thought Content: Thought content normal.     BP 120/81   Pulse 80   Temp 98.1 F (36.7 C) (Oral)   Ht 5\' 2"  (1.575 m)   Wt 157 lb 3.2 oz (71.3 kg)   LMP 03/01/2015   SpO2 99% Comment: on RA  BMI 28.75 kg/m  Wt Readings from Last 3 Encounters:  04/17/20 157 lb 3.2 oz (71.3 kg)  07/20/19 165 lb (74.8 kg)  06/27/19 170 lb (77.1 kg)   BP Readings from Last 3 Encounters:  04/17/20 120/81  08/20/19 126/85  07/20/19 104/65     Health Maintenance Due  Topic Date Due  . Hepatitis C Screening  Never done  . MAMMOGRAM  12/27/2016    There are no preventive care reminders to display for this patient.  Lab Results  Component Value Date   TSH 3.740 05/19/2019   Lab Results  Component Value Date   WBC 5.6 05/19/2019   HGB 14.8 05/19/2019   HCT 43.2 05/19/2019   MCV 98 (H) 05/19/2019   PLT 181 05/19/2019   Lab Results  Component Value Date   NA 138 05/19/2019   K 4.4 05/19/2019   CO2 24 05/19/2019   GLUCOSE 92 05/19/2019   BUN 12  05/19/2019   CREATININE 0.91 05/19/2019   BILITOT 1.0 05/19/2019   ALKPHOS 69 05/19/2019   AST 35 05/19/2019   ALT 39 (H) 05/19/2019   PROT 7.1 05/19/2019   ALBUMIN 4.8 05/19/2019   CALCIUM  9.4 05/19/2019   ANIONGAP 10 01/30/2015   Lab Results  Component Value Date   CHOL 211 (H) 05/19/2019   Lab Results  Component Value Date   HDL 61 05/19/2019   Lab Results  Component Value Date   LDLCALC 130 (H) 05/19/2019   Lab Results  Component Value Date   TRIG 111 05/19/2019   Lab Results  Component Value Date   CHOLHDL 3.5 05/19/2019   Lab Results  Component Value Date   HGBA1C 5.2 05/19/2019      Assessment & Plan:   Problem List Items Addressed This Visit      Endocrine   Hypothyroidism - Primary   Relevant Medications   SYNTHROID 112 MCG tablet   Other Relevant Orders   TSH     Other   Menopausal vasomotor syndrome   Relevant Medications   escitalopram (LEXAPRO) 5 MG tablet   norethindrone (HEATHER) 0.35 MG tablet   Other Relevant Orders   CBC with Differential/Platelet   Comprehensive metabolic panel   TSH   Long-term current use of thyroid hormone replacement therapy   Relevant Orders   CBC with Differential/Platelet   Comprehensive metabolic panel   TSH   Lipid panel   Breast cancer screening by mammogram   Relevant Orders   MM DIGITAL SCREENING W/ IMPLANTS BILATERAL    Other Visit Diagnoses    Other long term (current) drug therapy       Relevant Orders   CBC with Differential/Platelet   Comprehensive metabolic panel   TSH   Lipid panel   Screening for lipid disorders       Relevant Orders   Lipid panel      Meds ordered this encounter  Medications  . escitalopram (LEXAPRO) 5 MG tablet    Sig: Take 1 tablet (5 mg total) by mouth daily.    Dispense:  90 tablet    Refill:  1    Order Specific Question:   Supervising Provider    Answer:   Emeterio Reeve [id number]  . norethindrone (HEATHER) 0.35 MG tablet    Sig: Take 1 tablet  (0.35 mg total) by mouth daily.    Dispense:  84 tablet    Refill:  1    Please fill Adalberto Cole, not generic    Order Specific Question:   Supervising Provider    Answer:   Emeterio Reeve [[id number]]  . SYNTHROID 112 MCG tablet    Sig: Take 1 tablet (112 mcg total) by mouth daily before breakfast.    Dispense:  90 tablet    Refill:  1    Fill brand Synthroid only    Order Specific Question:   Supervising Provider    Answer:   Emeterio Reeve [[id number]]   Reviewed HM - ordered mammogram - advised should do this at least yearly - Pap reviewed 11/19 - NILM, HPV negative- advised to repeat Q5Y 03/2023 - discussed colon cancer screening - she is interested in Cologuard, she is a candidate for this - will contact office when she turns 26 - form completed for insurance, signed and returned to patient - yearly follow-up with Ophthalmology - yearly skin checks with Derm (completed this year)  Personally reviewed labs dated 05/19/19 - abnormalities noted above Routine fasting labs as above Medication refills as above  Follow-up: Return in about 6 months (around 10/16/2020) for medication refills.    Trixie Dredge, [place]

## 2020-04-17 ENCOUNTER — Encounter: Payer: Self-pay | Admitting: Physician Assistant

## 2020-04-17 ENCOUNTER — Other Ambulatory Visit: Payer: Self-pay

## 2020-04-17 ENCOUNTER — Ambulatory Visit (INDEPENDENT_AMBULATORY_CARE_PROVIDER_SITE_OTHER): Payer: 59 | Admitting: Physician Assistant

## 2020-04-17 VITALS — BP 120/81 | HR 80 | Temp 98.1°F | Ht 62.0 in | Wt 157.2 lb

## 2020-04-17 DIAGNOSIS — E038 Other specified hypothyroidism: Secondary | ICD-10-CM | POA: Diagnosis not present

## 2020-04-17 DIAGNOSIS — Z1231 Encounter for screening mammogram for malignant neoplasm of breast: Secondary | ICD-10-CM | POA: Diagnosis not present

## 2020-04-17 DIAGNOSIS — Z79899 Other long term (current) drug therapy: Secondary | ICD-10-CM | POA: Diagnosis not present

## 2020-04-17 DIAGNOSIS — Z13220 Encounter for screening for lipoid disorders: Secondary | ICD-10-CM

## 2020-04-17 DIAGNOSIS — N951 Menopausal and female climacteric states: Secondary | ICD-10-CM | POA: Diagnosis not present

## 2020-04-17 DIAGNOSIS — E063 Autoimmune thyroiditis: Secondary | ICD-10-CM

## 2020-04-17 DIAGNOSIS — Z79890 Hormone replacement therapy (postmenopausal): Secondary | ICD-10-CM

## 2020-04-17 MED ORDER — ESCITALOPRAM OXALATE 5 MG PO TABS: 5.0000 mg | ORAL_TABLET | Freq: Every day | ORAL | 1 refills | Status: DC

## 2020-04-17 MED ORDER — NORETHINDRONE 0.35 MG PO TABS: 1.0000 | ORAL_TABLET | Freq: Every day | ORAL | 1 refills | Status: DC

## 2020-04-17 MED ORDER — SYNTHROID 112 MCG PO TABS: 112.0000 ug | ORAL_TABLET | Freq: Every day | ORAL | 1 refills | Status: DC

## 2020-04-18 ENCOUNTER — Telehealth: Payer: Self-pay | Admitting: Physician Assistant

## 2020-04-18 LAB — COMPREHENSIVE METABOLIC PANEL
ALT: 16 IU/L (ref 0–32)
AST: 20 IU/L (ref 0–40)
Albumin/Globulin Ratio: 1.9 (ref 1.2–2.2)
Albumin: 5 g/dL — ABNORMAL HIGH (ref 3.8–4.8)
Alkaline Phosphatase: 62 IU/L (ref 44–121)
BUN/Creatinine Ratio: 18 (ref 9–23)
BUN: 17 mg/dL (ref 6–24)
Bilirubin Total: 1.1 mg/dL (ref 0.0–1.2)
CO2: 26 mmol/L (ref 20–29)
Calcium: 9.7 mg/dL (ref 8.7–10.2)
Chloride: 102 mmol/L (ref 96–106)
Creatinine, Ser: 0.92 mg/dL (ref 0.57–1.00)
GFR calc Af Amer: 85 mL/min/{1.73_m2} (ref >59)
GFR calc non Af Amer: 73 mL/min/{1.73_m2} (ref >59)
Globulin, Total: 2.6 g/dL (ref 1.5–4.5)
Glucose: 84 mg/dL (ref 65–99)
Potassium: 5 mmol/L (ref 3.5–5.2)
Sodium: 142 mmol/L (ref 134–144)
Total Protein: 7.6 g/dL (ref 6.0–8.5)

## 2020-04-18 LAB — CBC WITH DIFFERENTIAL/PLATELET
Basophils Absolute: 0 10*3/uL (ref 0.0–0.2)
Basos: 1 %
EOS (ABSOLUTE): 0.1 10*3/uL (ref 0.0–0.4)
Eos: 2 %
Hematocrit: 45.1 % (ref 34.0–46.6)
Hemoglobin: 15.7 g/dL (ref 11.1–15.9)
Immature Grans (Abs): 0 10*3/uL (ref 0.0–0.1)
Immature Granulocytes: 0 %
Lymphocytes Absolute: 1.7 10*3/uL (ref 0.7–3.1)
Lymphs: 32 %
MCH: 33.5 pg — ABNORMAL HIGH (ref 26.6–33.0)
MCHC: 34.8 g/dL (ref 31.5–35.7)
MCV: 96 fL (ref 79–97)
Monocytes Absolute: 0.4 10*3/uL (ref 0.1–0.9)
Monocytes: 7 %
Neutrophils Absolute: 3 10*3/uL (ref 1.4–7.0)
Neutrophils: 58 %
Platelets: 202 10*3/uL (ref 150–450)
RBC: 4.68 x10E6/uL (ref 3.77–5.28)
RDW: 11.8 % (ref 11.7–15.4)
WBC: 5.2 10*3/uL (ref 3.4–10.8)

## 2020-04-18 LAB — LIPID PANEL
Chol/HDL Ratio: 3.4 ratio (ref 0.0–4.4)
Cholesterol, Total: 237 mg/dL — ABNORMAL HIGH (ref 100–199)
HDL: 70 mg/dL (ref >39)
LDL Chol Calc (NIH): 154 mg/dL — ABNORMAL HIGH (ref 0–99)
Triglycerides: 74 mg/dL (ref 0–149)
VLDL Cholesterol Cal: 13 mg/dL (ref 5–40)

## 2020-04-18 LAB — TSH: TSH: 3.25 u[IU]/mL (ref 0.450–4.500)

## 2020-04-18 NOTE — Telephone Encounter (Signed)
-----   Message from [facility], [place] sent at 04/18/2020 12:55 PM EST ----- Abbie Sons, There are no major concerns on your labs Cholesterol is a little higher compared to last year - does not require medication. Continue to work on increasing cholesterol-lowering foods and regular physical activity (anything that you enjoy).  [email or web address]

## 2020-04-18 NOTE — Progress Notes (Signed)
Hi Sabree, There are no major concerns on your labs Cholesterol is a little higher compared to last year - does not require medication. Continue to work on increasing cholesterol-lowering foods and regular physical activity (anything that you enjoy).  [email or web address]

## 2020-10-16 ENCOUNTER — Ambulatory Visit: Payer: 59 | Admitting: Physician Assistant

## 2021-04-15 ENCOUNTER — Telehealth: Payer: Self-pay | Admitting: Physician Assistant

## 2021-04-15 ENCOUNTER — Other Ambulatory Visit: Payer: Self-pay | Admitting: Physician Assistant

## 2021-04-15 DIAGNOSIS — Z1231 Encounter for screening mammogram for malignant neoplasm of breast: Secondary | ICD-10-CM

## 2021-04-15 NOTE — Telephone Encounter (Signed)
This patient called and needs a refill on her thyroid medication however you've not seen her but she is requesting to come get labs before making an appointment. Is this something you do? [phone]

## 2021-04-15 NOTE — Telephone Encounter (Signed)
When patient called back she now told me she was completely out of thyroid medication and she is starting to feel very sick without it and the labs would not be correct since she does not have any in her system. Please advise

## 2021-04-15 NOTE — Telephone Encounter (Signed)
Left message for a return call

## 2021-04-16 ENCOUNTER — Telehealth: Payer: Self-pay | Admitting: Physician Assistant

## 2021-04-16 ENCOUNTER — Other Ambulatory Visit: Payer: Self-pay | Admitting: Physician Assistant

## 2021-04-16 DIAGNOSIS — Z1231 Encounter for screening mammogram for malignant neoplasm of breast: Secondary | ICD-10-CM

## 2021-04-16 DIAGNOSIS — E063 Autoimmune thyroiditis: Secondary | ICD-10-CM

## 2021-04-16 DIAGNOSIS — E038 Other specified hypothyroidism: Secondary | ICD-10-CM

## 2021-04-16 MED ORDER — SYNTHROID 112 MCG PO TABS: 112.0000 ug | ORAL_TABLET | Freq: Every day | ORAL | 1 refills | Status: DC

## 2021-04-16 NOTE — Telephone Encounter (Signed)
Left msg for patient to call back. AS, CMA 

## 2021-04-16 NOTE — Telephone Encounter (Signed)
I notified her and made her appointments for labs/office visit can you please send over the synthroid?

## 2021-04-16 NOTE — Telephone Encounter (Signed)
close

## 2021-04-17 NOTE — Telephone Encounter (Signed)
Left msg for patient to call back.   Patient had lab apt scheduled for 05/01/2021 but since she has been out of medication we would not obtain labs until she has been on meds x 6 weeks.   I have attempted 2 times to contact patient in regards to thyroid medication and follow up apts that are required. AS, CMA

## 2021-04-17 NOTE — Telephone Encounter (Signed)
Attempted to contact patient again with no answer. Left message for patient to call back. AS, CMA

## 2021-04-18 ENCOUNTER — Ambulatory Visit
Admission: RE | Admit: 2021-04-18 | Discharge: 2021-04-18 | Disposition: A | Discharge status: Home / Self Care | Payer: 59 | Source: Ambulatory Visit

## 2021-04-18 DIAGNOSIS — Z1231 Encounter for screening mammogram for malignant neoplasm of breast: Secondary | ICD-10-CM

## 2021-05-01 ENCOUNTER — Other Ambulatory Visit: Payer: 59

## 2021-05-09 LAB — CBC WITH DIFFERENTIAL
BASO(ABSOLUTE): 0
Basophils Absolute: 1
EOS (ABSOLUTE): 0.2
EOS: 3 %
HCT: 45 — AB (ref 29–41)
Hemoglobin: 15.1
Immature Granulocytes: 0
Lymphs(Absolute): 1.9
MCH: 33.4
MCHC: 33.9
MCV: 99 (ref 76–111)
Monocytes(Absolute): 0.5
Monocytes: 8
Neutrophils Absolute: 4 /uL
Neutrophils: 57
Platelets: 206
RBC: 4.52 (ref 3.87–5.11)
RDW: 12.3
WBC: 6.2
lymph#: 31

## 2021-05-09 LAB — T4 AND TSH
T3, Free: 6.8
TSH: 3.26

## 2021-05-09 LAB — LIPID PANEL WITH LDL/HDL RATIO
Chol/HDL Ratio: 3.1
Cholesterol, Total: 205
HDL Cholesterol: 67 (ref 35–70)
LDL-Cholesterol: 124
Triglycerides: 76 (ref 40–160)
VLDL: 14 mg/dL

## 2021-05-09 LAB — COMPREHENSIVE METABOLIC PANEL
AST: 33
Albumin: 4.5
Alkaline Phosphatase: 64
BUN: 11
Bilirubin: 0.9
Calcium: 9.4
Chloride: 107
Creat: 0.87
GGT: 39
Globulin, Total: 2.4
Glucose: 102
Iron: 98
LDH: 216
Phosphorus: 3.9
Potassium: 5.6
Sodium: 144
Total Protein: 6.9 g/dL
Uric Acid: 5.8
eGFR: 81

## 2021-05-14 ENCOUNTER — Ambulatory Visit: Payer: 59 | Admitting: Physician Assistant

## 2021-05-15 ENCOUNTER — Ambulatory Visit: Payer: 59

## 2021-05-27 NOTE — Progress Notes (Signed)
Established Patient Office Visit  Subjective:  Patient ID: Jennifer Nicholson, female    DOB: Mar 26, 1971  Age: 51 y.o. MRN: [id number]  CC:  Chief Complaint  Patient presents with   Follow-up   Thyroid Problem    HPI Jennifer Nicholson presents for follow up on hypothyroidism. Patient reports has been taking thyroid medication for about a month and a half now without missing any doses. Does go to the Bariatric clinic where she routinely has labs done. Denies worsening fatigue from baseline, hair changes, or heat/cold intolerance. Patient reports was started on Citalopram for hot flashes which she self-tapered off. 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   Past Medical History:  Diagnosis Date   Chronic kidney disease    calculi   Kidney stone    Thyroid disease    hypothyroidism    Past Surgical History:  Procedure Laterality Date   BREAST SURGERY      Family History  Problem Relation Age of Onset   Thyroid disease Mother    Hypertension Father    Thyroid disease Sister    Psoriasis Sister    Thyroid disease Maternal Grandmother    Thyroid disease Sister    Healthy Daughter    Healthy Daughter     Social History   Socioeconomic History   Marital status: Single    Spouse name: Not on file   Number of children: Not on file   Years of education: Not on file   Highest education level: Not on file  Occupational History   Not on file  Tobacco Use   Smoking status: Never   Smokeless tobacco: Never  Vaping Use   Vaping Use: Never used  Substance and Sexual Activity   Alcohol use: Yes    Comment: occasional   Drug use: No   Sexual activity: Yes  Other Topics Concern   Not on file  Social History Narrative   Not on file   Social Determinants of Health   Financial Resource Strain: Not on file  Food Insecurity: Not on file  Transportation Needs: Not on file  Physical Activity: Not  on file  Stress: Not on file  Social Connections: Not on file  Intimate Partner Violence: Not on file    Outpatient Medications Prior to Visit  Medication Sig Dispense Refill   SYNTHROID 112 MCG tablet Take 1 tablet (112 mcg total) by mouth daily before breakfast. 30 tablet 1   escitalopram (LEXAPRO) 5 MG tablet Take 1 tablet (5 mg total) by mouth daily. 90 tablet 1   norethindrone (HEATHER) 0.35 MG tablet Take 1 tablet (0.35 mg total) by mouth daily. 84 tablet 1   No facility-administered medications prior to visit.    Allergies  Allergen Reactions   Codeine     Other reaction(s): Unknown   Hydrocodone Itching    ROS Review of Systems Review of Systems:  A fourteen system review of systems was performed and found to be positive as per HPI.   Objective:    Physical Exam General:  Well Developed, well nourished, appropriate for stated age.  Neuro:  Alert and oriented,  extra-ocular muscles intact  HEENT:  Normocephalic, atraumatic, neck supple  Skin:  no gross rash, warm, pink. Cardiac:  RRR, S1 S2 Respiratory: CTA B/L  Vascular:  Ext warm, no cyanosis apprec.; cap RF less 2 sec. Psych:  No HI/SI, judgement and insight good,  Euthymic mood. Full Affect.  BP 110/74    Pulse 86    Temp (!) 97.5 F (36.4 C)    Ht 5\' 2"  (1.575 m)    Wt 153 lb (69.4 kg)    LMP 03/01/2015    SpO2 100%    BMI 27.98 kg/m  Wt Readings from Last 3 Encounters:  05/28/21 153 lb (69.4 kg)  04/17/20 157 lb 3.2 oz (71.3 kg)  07/20/19 165 lb (74.8 kg)     Health Maintenance Due  Topic Date Due   HIV Screening  Never done   Hepatitis C Screening  Never done   Zoster Vaccines- Shingrix (1 of 2) Never done   Fecal DNA ([facility]olog[facility])  Never done   INFLUENZA VACCINE  12/03/2020    There are no preventive care reminders to display for this patient.  Lab Results  Component Value Date   TSH 3.250 04/17/2020   Lab Results  Component Value Date   WBC 5.2 04/17/2020   HGB 15.7 04/17/2020   HCT  45.1 04/17/2020   MCV 96 04/17/2020   PLT 202 04/17/2020   Lab Results  Component Value Date   NA 142 04/17/2020   K 5.0 04/17/2020   CO2 26 04/17/2020   GLUCOSE 84 04/17/2020   BUN 17 04/17/2020   CREATININE 0.92 04/17/2020   BILITOT 1.1 04/17/2020   ALKPHOS 62 04/17/2020   AST 20 04/17/2020   ALT 16 04/17/2020   PROT 7.6 04/17/2020   ALBUMIN 5.0 (H) 04/17/2020   CALCIUM 9.7 04/17/2020   ANIONGAP 10 01/30/2015   Lab Results  Component Value Date   CHOL 237 (H) 04/17/2020   Lab Results  Component Value Date   HDL 70 04/17/2020   Lab Results  Component Value Date   LDLCALC 154 (H) 04/17/2020   Lab Results  Component Value Date   TRIG 74 04/17/2020   Lab Results  Component Value Date   CHOLHDL 3.4 04/17/2020   Lab Results  Component Value Date   HGBA1C 5.2 05/19/2019      Assessment & Plan:   Problem List Items Addressed This Visit       Endocrine   Hypothyroidism - Primary    -Stable. -Continue current medication regimen. -Rechecking thyroid labs today. Pending results will make medication adjustments if indicated or send additional refills.       Relevant Orders   TSH   T4, free   T3     Other   Menopausal vasomotor syndrome    -Stable. -Continue current medication regimen. -Advised to schedule CPE.      Relevant Medications   norethindrone (HEATHER) 0.35 MG tablet       Meds ordered this encounter  Medications   norethindrone (HEATHER) 0.35 MG tablet    Sig: Take 1 tablet (0.35 mg total) by mouth daily.    Dispense:  84 tablet    Refill:  1    Please fill Adalberto Cole, not generic    Order Specific Question:   Supervising Provider    Answer:   Beatrice Lecher D [[id number]]    Follow-up: Return in about 3 months (around 08/26/2021) for CPE (pt bringing copy of labs from Bariatric clinic).    Lorrene Reid, PA-C

## 2021-05-28 ENCOUNTER — Ambulatory Visit (INDEPENDENT_AMBULATORY_CARE_PROVIDER_SITE_OTHER): Payer: 59 | Admitting: Physician Assistant

## 2021-05-28 ENCOUNTER — Encounter: Payer: Self-pay | Admitting: Physician Assistant

## 2021-05-28 ENCOUNTER — Other Ambulatory Visit: Payer: Self-pay

## 2021-05-28 VITALS — BP 110/74 | HR 86 | Temp 97.5°F | Ht 62.0 in | Wt 153.0 lb

## 2021-05-28 DIAGNOSIS — E038 Other specified hypothyroidism: Secondary | ICD-10-CM | POA: Diagnosis not present

## 2021-05-28 DIAGNOSIS — N951 Menopausal and female climacteric states: Secondary | ICD-10-CM | POA: Diagnosis not present

## 2021-05-28 DIAGNOSIS — E063 Autoimmune thyroiditis: Secondary | ICD-10-CM | POA: Diagnosis not present

## 2021-05-28 DIAGNOSIS — L821 Other seborrheic keratosis: Secondary | ICD-10-CM | POA: Insufficient documentation

## 2021-05-28 MED ORDER — NORETHINDRONE 0.35 MG PO TABS: 1.0000 | ORAL_TABLET | Freq: Every day | ORAL | 1 refills | Status: AC

## 2021-05-28 NOTE — Assessment & Plan Note (Signed)
-  Stable. -Continue current medication regimen. -Advised to schedule CPE.

## 2021-05-28 NOTE — Assessment & Plan Note (Signed)
-  Stable. -Continue current medication regimen. -Rechecking thyroid labs today. Pending results will make medication adjustments if indicated or send additional refills.

## 2021-05-29 ENCOUNTER — Other Ambulatory Visit: Payer: Self-pay | Admitting: Physician Assistant

## 2021-05-29 DIAGNOSIS — E063 Autoimmune thyroiditis: Secondary | ICD-10-CM

## 2021-05-29 LAB — T4, FREE: Free T4: 1.11 ng/dL (ref 0.82–1.77)

## 2021-05-29 LAB — T3: T3, Total: 81 ng/dL (ref 71–180)

## 2021-05-29 LAB — TSH: TSH: 1.76 u[IU]/mL (ref 0.450–4.500)

## 2021-05-29 MED ORDER — SYNTHROID 112 MCG PO TABS: 112.0000 ug | ORAL_TABLET | Freq: Every day | ORAL | 1 refills | Status: DC

## 2021-05-31 ENCOUNTER — Encounter: Payer: Self-pay | Admitting: Physician Assistant

## 2021-08-27 ENCOUNTER — Encounter: Payer: 59 | Admitting: Physician Assistant

## 2022-02-11 DIAGNOSIS — Z1211 Encounter for screening for malignant neoplasm of colon: Secondary | ICD-10-CM

## 2022-02-27 ENCOUNTER — Other Ambulatory Visit: Payer: Self-pay | Admitting: Physician Assistant

## 2022-02-27 DIAGNOSIS — E038 Other specified hypothyroidism: Secondary | ICD-10-CM

## 2022-03-19 ENCOUNTER — Telehealth: Payer: Self-pay | Admitting: *Deleted

## 2022-03-19 NOTE — Telephone Encounter (Signed)
LVM for pt to call office to see what kind of insurance she currently has so we can send this to [facility], will also send a MyChart message. Jennifer KitchenApril Zimmerman Rumple, CMA

## 2022-06-05 LAB — COLOGUARD: COLOGUARD: NEGATIVE

## 2022-06-12 ENCOUNTER — Telehealth: Payer: Self-pay | Admitting: *Deleted

## 2022-06-12 NOTE — Telephone Encounter (Signed)
Pt calling requesting a Rx of Diamox for altitude sickness.  She stated that she gets sick in [place] and she is going to [place] and feel like it would be worse that our mountains.  She leaves today and comes back Sunday.  She would need 4 days worth.  Informed her that she was over due for appointment so we scheduled her for her annual physical with labs on 3/13, 3/21.  She would like this to be sent to [facility] and she said that she confirmed with her eye doctor that it would be ok to take but he could not fill the Rx. Jennifer Nicholson, CMA

## 2022-06-12 NOTE — Telephone Encounter (Signed)
I have never seen this patient. Also, this medication is not on her medication list at all, past meds or present. I don't feel comfortable doing this prescription.

## 2022-06-12 NOTE — Telephone Encounter (Signed)
Patient has not been seen in office since 05/28/21. Please advise

## 2022-06-12 NOTE — Telephone Encounter (Signed)
Patient was provided Jennifer Bellman, FNP response and patient expressed great disappointment. She has requested that all future appointments be cancelled because she will establish care at a new practice.

## 2022-07-16 ENCOUNTER — Other Ambulatory Visit: Payer: Self-pay

## 2022-07-24 ENCOUNTER — Encounter: Payer: Self-pay | Admitting: Family Medicine

## 2023-08-26 ENCOUNTER — Encounter: Payer: Self-pay | Admitting: Dermatology

## 2023-08-26 ENCOUNTER — Ambulatory Visit: Admitting: Dermatology

## 2023-08-26 VITALS — BP 142/93 | HR 80

## 2023-08-26 DIAGNOSIS — D485 Neoplasm of uncertain behavior of skin: Secondary | ICD-10-CM

## 2023-08-26 DIAGNOSIS — W908XXA Exposure to other nonionizing radiation, initial encounter: Secondary | ICD-10-CM

## 2023-08-26 DIAGNOSIS — L578 Other skin changes due to chronic exposure to nonionizing radiation: Secondary | ICD-10-CM | POA: Diagnosis not present

## 2023-08-26 DIAGNOSIS — C4361 Malignant melanoma of right upper limb, including shoulder: Secondary | ICD-10-CM

## 2023-08-26 DIAGNOSIS — D492 Neoplasm of unspecified behavior of bone, soft tissue, and skin: Secondary | ICD-10-CM | POA: Diagnosis not present

## 2023-08-26 NOTE — Progress Notes (Signed)
   New Patient Visit   Subjective  Jennifer Nicholson is a 53 y.o. female who presents for the following: spot of concern. No hx of skin cancer. Present for several weeks, not painful but growing. States that some friends have noticed and recommended she get it checked.   The patient has spots, moles and lesions to be evaluated, some may be new or changing.  The following portions of the chart were reviewed this encounter and updated as appropriate: medications, allergies, medical history  Review of Systems:  No other skin or systemic complaints except as noted in HPI or Assessment and Plan.  Objective  Well appearing patient in no apparent distress; mood and affect are within normal limits.   A focused examination was performed of the following areas:  Right shoulder  Relevant exam findings are noted in the Assessment and Plan.  Right Upper Arm - Anterior 1.2 cm brown black papule   Assessment & Plan   ACTINIC DAMAGE - chronic, secondary to cumulative UV radiation exposure/sun exposure over time - diffuse scaly erythematous macules with underlying dyspigmentation - Recommend daily broad spectrum sunscreen SPF 30+ to sun-exposed areas, reapply every 2 hours as needed.  - Recommend staying in the shade or wearing long sleeves, sun glasses (UVA+UVB protection) and wide brim hats (4-inch brim around the entire circumference of the hat). - Call for new or changing lesions.  NEOPLASM OF UNCERTAIN BEHAVIOR OF SKIN Right Upper Arm - Anterior Skin / nail biopsy Type of biopsy: tangential   Informed consent: discussed and consent obtained   Timeout: patient name, date of birth, surgical site, and procedure verified   Procedure prep:  Patient was prepped and draped in usual sterile fashion Prep type:  Isopropyl alcohol Anesthesia: the lesion was anesthetized in a standard fashion   Anesthetic:  1% lidocaine w/ epinephrine 1-100,000 buffered w/ 8.4% NaHCO3 Instrument used: DermaBlade    Hemostasis achieved with: aluminum chloride   Outcome: patient tolerated procedure well   Post-procedure details: sterile dressing applied and wound care instructions given   Dressing type: bandage and petrolatum   Specimen 1 - Surgical pathology Differential Diagnosis: r/o MM vs other  Check Margins: No  Return in about 3 weeks (around 09/16/2023) for TBSC .  I, Haig Levan, Surg Tech III, am acting as scribe for Deneise Finlay, MD.   Documentation: I have reviewed the above documentation for accuracy and completeness, and I agree with the above.  Deneise Finlay, MD

## 2023-08-26 NOTE — Patient Instructions (Signed)

## 2023-09-02 LAB — SURGICAL PATHOLOGY

## 2023-09-08 ENCOUNTER — Encounter: Payer: Self-pay | Admitting: Dermatology

## 2023-09-10 ENCOUNTER — Encounter: Payer: Self-pay | Admitting: Dermatology

## 2023-09-10 ENCOUNTER — Ambulatory Visit (INDEPENDENT_AMBULATORY_CARE_PROVIDER_SITE_OTHER): Admitting: Dermatology

## 2023-09-10 VITALS — BP 133/83 | HR 93 | Temp 98.1°F

## 2023-09-10 DIAGNOSIS — L814 Other melanin hyperpigmentation: Secondary | ICD-10-CM | POA: Diagnosis not present

## 2023-09-10 DIAGNOSIS — C4361 Malignant melanoma of right upper limb, including shoulder: Secondary | ICD-10-CM

## 2023-09-10 DIAGNOSIS — L579 Skin changes due to chronic exposure to nonionizing radiation, unspecified: Secondary | ICD-10-CM

## 2023-09-10 DIAGNOSIS — C439 Malignant melanoma of skin, unspecified: Secondary | ICD-10-CM

## 2023-09-10 MED ORDER — TRAMADOL HCL 50 MG PO TABS: 50.0000 mg | ORAL_TABLET | Freq: Four times a day (QID) | ORAL | 0 refills | Status: AC | PRN

## 2023-09-10 NOTE — Progress Notes (Signed)
 Follow-Up Visit   Subjective  Jennifer Nicholson is a 53 y.o. female who presents for the following: Mohs of an Invasive Melanoma (0.6 mm) on the Right upper arm-anterior, biopsied by Dr. Fain Home.   The following portions of the chart were reviewed this encounter and updated as appropriate: medications, allergies, medical history  Review of Systems:  No other skin or systemic complaints except as noted in HPI or Assessment and Plan.  Objective  Well appearing patient in no apparent distress; mood and affect are within normal limits.  A focused examination was performed of the following areas: Right upper arm-anterior Relevant physical exam findings are noted in the Assessment and Plan.   Right Upper Arm - Anterior Healing biopsy site   Assessment & Plan   MALIGNANT MELANOMA OF SKIN (HCC) Right Upper Arm - Anterior Mohs surgery  Consent obtained: written  Anticoagulation: Was the anticoagulation regimen changed prior to Mohs? No    Anesthesia: Anesthesia method: local infiltration Local anesthetic: lidocaine 1% WITH epi  Procedure Details: Timeout: pre-procedure verification complete Procedure Prep: patient was prepped and draped in usual sterile fashion Prep type: chlorhexidine Pre-Op diagnosis: melanoma Melanoma subtype: invasive Melanoma Breslow depth (mm): 1 MohsAIQ Surgical site (if tumor spans multiple areas, please select predominant area): upper extremity Surgery side: right Surgical site (from skin exam): Right Upper Arm - Anterior Pre-operative length (cm): 1.3 Pre-operative width (cm): 1 Indications for Mohs surgery: anatomic location where tissue conservation is critical and aggressive histology  Micrographic Surgery Details: Post-operative length (cm): 4 Post-operative width (cm): 3.8 Number of Mohs stages: 4 Related Medications traMADol  (ULTRAM ) 50 MG tablet Take 1 tablet (50 mg total) by mouth every 6 (six) hours as needed for up to 10  days.   Return in 4 days (on 09/14/2023).  Flora Humphreys, CMA, am acting as scribe for Deneise Finlay, MD.    09/11/2023  HISTORY OF PRESENT ILLNESS  Jennifer Nicholson is seen in consultation at the request of Dr. Fain Home for biopsy-proven Invasive Melanoma of the right arm (0.6 mm). They note that the area has been present for about 2 months increasing in size with time.  There is no history of previous treatment.  Reports no other new or changing lesions and has no other complaints today.  Medications and allergies: see patient chart.  Review of systems: Reviewed 8 systems and notable for the above skin cancer.  All other systems reviewed are unremarkable/negative, unless noted in the HPI. Past medical history, surgical history, family history, social history were also reviewed and are noted in the chart/questionnaire.    PHYSICAL EXAMINATION  General: Well-appearing, in no acute distress, alert and oriented x 4. Vitals reviewed in chart (if available).   Skin: Exam reveals a 1.3 x 1.0 cm erythematous papule and biopsy scar on the right arm. There are rhytids, telangiectasias, and lentigines, consistent with photodamage. Lymph nodes: No cervical, axillary or inguinal lymphadenopathy.  Biopsy report(s) reviewed, confirming the diagnosis.   ASSESSMENT  1) Invasive Melanoma of the Right arm (0.6 mm) 2) photodamage 3) solar lentigines   PLAN   1. Due to location, size, histology, or recurrence and the likelihood of subclinical extension as well as the need to conserve normal surrounding tissue, the patient was deemed acceptable for Mohs micrographic surgery (MMS).  The nature and purpose of the procedure, associated benefits and risks including recurrence and scarring, possible complications such as pain, infection, and bleeding, and alternative methods of treatment if appropriate were  discussed with the patient during consent. The lesion location was verified by the patient, by reviewing  previous notes, pathology reports, and by photographs as well as angulation measurements if available.  Informed consent was reviewed and signed by the patient, and timeout was performed at 8:15 AM. See op note below.  2. For the photodamage and solar lentigines, sun protection discussed/information given on OTC sunscreens, and we recommend continued regular follow-up with primary dermatologist every 6 months or sooner for any growing, bleeding, or changing lesions. 3. Prognosis and future surveillance discussed. 4. Letter with treatment outcome sent to referring provider. 5. Pain acetaminophen /ibuprofen/tramadol  50 mg  MOHS MICROGRAPHIC SURGERY AND RECONSTRUCTION  Initial size:   1.3 x 1.0 cm Surgical defect/wound size: 4.0 x 3.8 cm Anesthesia:    0.33% lidocaine with 1:200,000 epinephrine EBL:    <5 mL Complications:  None Repair type:   Delayed Repair   Stages: 4  STAGE I: Anesthesia achieved with 0.5% lidocaine with 1:200,000 epinephrine. ChloraPrep applied. 5 section(s) excised using Mohs technique (this includes total peripheral and deep tissue margin excision and evaluation with frozen sections, excised and interpreted by the same physician). The tumor was first debulked and then excised with an approx. 2 mm margin.  Hemostasis was achieved with electrocautery as needed.  The specimen was then oriented, subdivided/relaxed, inked, and processed using Mohs technique.  Tissue was stained with H&E and MART-1.   Frozen section analysis revealed a positive margin for atypical proliferation of melanocytes arranged in a confluent and pagetoid pattern along the basal and suprabasal layers. The melanocytes exhibit nuclear pleomorphism, hyperchromasia, and prominent nucleoli. There is an increased number of melanocytes at the dermoepidermal junction with focal nesting and single-cell spread. No evidence of dermal invasion is seen in the peripheral margin.    STAGE II: An additional 2 mm margin was  excised.  Hemostasis was achieved with electrocautery as needed.  The specimen was then oriented, subdivided/relaxed, inked, and processed using Mohs technique. Tissue was stained with H&E and MART-1.   Frozen section analysis revealed a positive margin for dermal-based malignant melanocytic neoplasm composed of atypical epithelioid and/or spindle-shaped melanocytes arranged in nests, sheets, and single cells. The tumor cells exhibit marked cytologic atypia with hyperchromatic, pleomorphic nuclei, prominent nucleoli, and variable amounts of eosinophilic cytoplasm. Numerous mitotic figures, including atypical forms, are present. Melanin pigment may be seen within tumor cells and in melanophages. There is no connection to the overlying epidermis identified on multiple levels. The surrounding dermis shows variable degrees of fibrosis and a brisk lymphocytic infiltrate. Present in the peripheral margin.  STAGE III: An additional 2 mm margin was excised.  Hemostasis was achieved with electrocautery as needed.  The specimen was then oriented, subdivided/relaxed, inked, and processed using Mohs technique. Tissue was stained with H&E and MART-1 and SOX-10 immunostains (2 separate antibodies).   Frozen section analysis revealed a positive margin for dermal-based malignant melanocytic neoplasm composed of atypical epithelioid and/or spindle-shaped melanocytes arranged in nests, sheets, and single cells. The tumor cells exhibit marked cytologic atypia with hyperchromatic, pleomorphic nuclei, prominent nucleoli, and variable amounts of eosinophilic cytoplasm. Numerous mitotic figures, including atypical forms, are present. Melanin pigment may be seen within tumor cells and in melanophages. There is no connection to the overlying epidermis identified on multiple levels. The surrounding dermis shows variable degrees of fibrosis and a brisk lymphocytic infiltrate. Present in the peripheral margin.  STAGE IV: An additional  2 mm margin was excised.  Hemostasis was achieved with electrocautery as  needed.  The specimen was then oriented, subdivided/relaxed, inked, and processed using Mohs technique. Evaluation of slides by the Mohs surgeon revealed clear tumor margins. Tissue was stained with H&E and MART-1 and SOX-10 immunostains (2 separate antibodies).  The initial excised tissue layer has been submitted to dermatopathology for permanent sectioning and immunohistochemical staining to confirm that the deep surgical margin is free of tumor involvement. This approach ensures comprehensive histologic evaluation, particularly important in cases where frozen section analysis may be limited in detecting subtle or deeply situated melanocytic proliferation.   Reconstruction  Delayed Reconstruction  The decision to delay the reconstruction of the Mohs wound until one week post-procedure is based the timing of the clearing of the tumor and the closing of the clinic at the end of the day.  This waiting period also allows for a better assessment of the wound's healing potential and ensures that the tissue is sufficiently vascularized, reducing the likelihood of complications such as necrosis or infection. Therefore, the delayed repair is a strategic measure to enhance the long-term outcome of the reconstructive procedure.  The patient will follow up in: 4 days    Documentation: I have reviewed the above documentation for accuracy and completeness, and I agree with the above.  Deneise Finlay, MD

## 2023-09-10 NOTE — Patient Instructions (Addendum)

## 2023-09-14 ENCOUNTER — Ambulatory Visit (INDEPENDENT_AMBULATORY_CARE_PROVIDER_SITE_OTHER): Admitting: Dermatology

## 2023-09-14 ENCOUNTER — Encounter: Payer: Self-pay | Admitting: Dermatology

## 2023-09-14 VITALS — BP 161/71 | HR 74 | Temp 98.6°F

## 2023-09-14 DIAGNOSIS — C439 Malignant melanoma of skin, unspecified: Secondary | ICD-10-CM

## 2023-09-14 DIAGNOSIS — C4361 Malignant melanoma of right upper limb, including shoulder: Secondary | ICD-10-CM

## 2023-09-14 NOTE — Progress Notes (Signed)
 Follow-Up Visit   Subjective  Jennifer Nicholson is a 53 y.o. female who presents for the following: delayed closure of a Mohs defect from Melanoma on the  right arm anterior, treated on 09/10/2023. She states that she had some nausea from the pain medicine but is otherwise doing well.   The following portions of the chart were reviewed this encounter and updated as appropriate: medications, allergies, medical history  Review of Systems:  No other skin or systemic complaints except as noted in HPI or Assessment and Plan.  Objective  Well appearing patient in no apparent distress; mood and affect are within normal limits.  A focused examination was performed of the following areas: Right arm anterior Relevant physical exam findings are noted in the Assessment and Plan.   Right Upper Arm - Anterior Post op size 4.6 x 4.3    Assessment & Plan   MALIGNANT MELANOMA OF SKIN (HCC) Right Upper Arm - Anterior Skin repair Complexity:  Complex Final length (cm):  12.5 Informed consent: discussed and consent obtained   Timeout: patient name, date of birth, surgical site, and procedure verified   Procedure prep:  Patient was prepped and draped in usual sterile fashion Prep type:  Chlorhexidine Anesthesia: the lesion was anesthetized in a standard fashion   Anesthetic:  1% lidocaine w/ epinephrine 1-100,000 buffered w/ 8.4% NaHCO3 Reason for type of repair: allow closure of the large defect, preserve normal anatomy, avoid adjacent structures, allow side-to-side closure without requiring a flap or graft and compensate for the inelasticity of skin in this area   Undermining: area extensively undermined   Subcutaneous layers (deep stitches):  Suture size:  3-0 Suture type: PDS (polydioxanone)   Stitches:  Buried vertical mattress Fine/surface layer approximation (top stitches):  Suture type: cyanoacrylate tissue glue   Suture removal (days):  0 Hemostasis achieved with: suture, pressure and  electrodesiccation Outcome: patient tolerated procedure well with no complications   Post-procedure details: sterile dressing applied and wound care instructions given   Dressing type: bandage and pressure dressing   Related Medications traMADol  (ULTRAM ) 50 MG tablet Take 1 tablet (50 mg total) by mouth every 6 (six) hours as needed for up to 10 days.   MOHS MICROGRAPHIC SURGERY AND RECONSTRUCTION  Surgical defect/wound size: 4.0 x 3.8 cm Anesthesia:    0.33% lidocaine with 1:200,000 epinephrine EBL:    <5 mL Complications:  None Repair type:   Complex SQ suture:   3-0 PDS Cutaneous suture:  Dermabond and Steristrips Final size of the repair: 12.5 cm  Reconstruction  The surgical wound was then cleaned, prepped, and re-anesthetized as above. Wound edges were undermined extensively along at least one entire edge and at a distance equal to or greater than the width of the defect (see wound defect size above) in order to achieve closure and decrease wound tension and anatomic distortion. Redundant tissue repair including standing cone removal was performed. Hemostasis was achieved with electrocautery. Subcutaneous and epidermal tissues were approximated with the above sutures. The surgical site was then lightly scrubbed with sterile, saline-soaked gauze. Steri-strips were applied, and the area was then bandaged using Vaseline ointment, non-adherent gauze, gauze pads, and tape to provide an adequate pressure dressing. The patient tolerated the procedure well, was given detailed written and verbal wound care instructions, and was discharged in good condition.   The patient will follow-up: 1 week  .   Return in 1 week (on 09/21/2023) for TBSE.  IHaig Levan, Surg Tech III,  am acting as scribe for Deneise Finlay, MD.   Documentation: I have reviewed the above documentation for accuracy and completeness, and I agree with the above.  Deneise Finlay, MD

## 2023-09-14 NOTE — Patient Instructions (Signed)

## 2023-09-21 ENCOUNTER — Encounter: Payer: Self-pay | Admitting: Dermatology

## 2023-09-21 ENCOUNTER — Ambulatory Visit: Admitting: Dermatology

## 2023-09-21 DIAGNOSIS — L578 Other skin changes due to chronic exposure to nonionizing radiation: Secondary | ICD-10-CM

## 2023-09-21 DIAGNOSIS — L814 Other melanin hyperpigmentation: Secondary | ICD-10-CM | POA: Diagnosis not present

## 2023-09-21 DIAGNOSIS — L821 Other seborrheic keratosis: Secondary | ICD-10-CM | POA: Diagnosis not present

## 2023-09-21 DIAGNOSIS — W908XXA Exposure to other nonionizing radiation, initial encounter: Secondary | ICD-10-CM

## 2023-09-21 DIAGNOSIS — D229 Melanocytic nevi, unspecified: Secondary | ICD-10-CM

## 2023-09-21 DIAGNOSIS — C439 Malignant melanoma of skin, unspecified: Secondary | ICD-10-CM | POA: Insufficient documentation

## 2023-09-21 DIAGNOSIS — D1801 Hemangioma of skin and subcutaneous tissue: Secondary | ICD-10-CM

## 2023-09-21 DIAGNOSIS — T1490XD Injury, unspecified, subsequent encounter: Secondary | ICD-10-CM

## 2023-09-21 DIAGNOSIS — Z1283 Encounter for screening for malignant neoplasm of skin: Secondary | ICD-10-CM | POA: Diagnosis not present

## 2023-09-21 HISTORY — DX: Malignant melanoma of skin, unspecified: C43.9

## 2023-09-21 NOTE — Patient Instructions (Addendum)
 Some Recommended Sunscreens for Sensitive Skin Include:  Body or All Over Sunscreen (water resistant) EltaMD UV Pure Blue lizard sensitive Sun bum mineral (avoid if sensitive to scent) Aveeno Positively Mineral Neutrogena sheer zinc (Slightly harder to rub in) [facility] clear zinc (Slightly harder to rub in) Vanicream mineral sunscreen spf 50+  Face Sunscreen EltaMD UV Elements (tinted) EltaMD UV Restore (tinted or nontinted) EltaMD UV Physical (tinted) CeraVe hydrating sunscreen 50 face (tinted or nontinted) Colorescience Sunforgettable Total Protection Face Shield (good for most skin tones) La Roche Posay Mineral Tinted Cotz Flawless Complexion   Powder Sunscreen (Nice for reapplying or applying on the go) Colorescience Sunforgettable Total Protection Brush on Shield (available in different tints)  Important Information  Due to recent changes in healthcare laws, you may see results of your pathology and/or laboratory studies on MyChart before the doctors have had a chance to review them. We understand that in some cases there may be results that are confusing or concerning to you. Please understand that not all results are received at the same time and often the doctors may need to interpret multiple results in order to provide you with the best plan of care or course of treatment. Therefore, we ask that you please give us  2 business days to thoroughly review all your results before contacting the office for clarification. Should we see a critical lab result, you will be contacted sooner.   If You Need Anything After Your Visit  If you have any questions or concerns for your doctor, please call our main line at [phone] If no one answers, please leave a voicemail as directed and we will return your call as soon as possible. Messages left after 4 pm will be answered the following business day.   You may also send us  a message via MyChart. We typically respond to MyChart messages within  1-2 business days.  For prescription refills, please ask your pharmacy to contact our office. Our fax number is [phone].  If you have an urgent issue when the clinic is closed that cannot wait until the next business day, you can page your doctor at the number below.    Please note that while we do our best to be available for urgent issues outside of office hours, we are not available 24/7.   If you have an urgent issue and are unable to reach us , you may choose to seek medical care at your doctor's office, retail clinic, urgent care center, or emergency room.  If you have a medical emergency, please immediately call 911 or go to the emergency department. In the event of inclement weather, please call our main line at [phone] for an update on the status of any delays or closures.  Dermatology Medication Tips: Please keep the boxes that topical medications come in in order to help keep track of the instructions about where and how to use these. Pharmacies typically print the medication instructions only on the boxes and not directly on the medication tubes.   If your medication is too expensive, please contact our office at [phone] or send us  a message through MyChart.   We are unable to tell what your co-pay for medications will be in advance as this is different depending on your insurance coverage. However, we may be able to find a substitute medication at lower cost or fill out paperwork to get insurance to cover a needed medication.   If a prior authorization is required to get your medication covered by  your insurance company, please allow us  1-2 business days to complete this process.  Drug prices often vary depending on where the prescription is filled and some pharmacies may offer cheaper prices.  The website www.goodrx.com contains coupons for medications through different pharmacies. The prices here do not account for what the cost may be with help from insurance (it  may be cheaper with your insurance), but the website can give you the price if you did not use any insurance.  - You can print the associated coupon and take it with your prescription to the pharmacy.  - You may also stop by our office during regular business hours and pick up a GoodRx coupon card.  - If you need your prescription sent electronically to a different pharmacy, notify our office through [facility] or by phone at [phone]

## 2023-09-21 NOTE — Progress Notes (Signed)
   Follow-Up Visit   Subjective  Jennifer Nicholson is a 53 y.o. female who presents for the following: Skin Cancer Screening and Full Body Skin Exam  The patient presents for Total-Body Skin Exam (TBSE) for skin cancer screening and mole check. The patient has spots, moles and lesions to be evaluated, some may be new or changing.  The following portions of the chart were reviewed this encounter and updated as appropriate: medications, allergies, medical history  Review of Systems:  No other skin or systemic complaints except as noted in HPI or Assessment and Plan.  Objective  Well appearing patient in no apparent distress; mood and affect are within normal limits.  A full examination was performed including scalp, head, eyes, ears, nose, lips, neck, chest, axillae, abdomen, back, buttocks, bilateral upper extremities, bilateral lower extremities, hands, feet, fingers, toes, fingernails, and toenails. All findings within normal limits unless otherwise noted below.   Relevant physical exam findings are noted in the Assessment and Plan.    Assessment & Plan   SKIN CANCER SCREENING PERFORMED TODAY.  ACTINIC DAMAGE - Chronic condition, secondary to cumulative UV/sun exposure - diffuse scaly erythematous macules with underlying dyspigmentation - Recommend daily broad spectrum sunscreen SPF 30+ to sun-exposed areas, reapply every 2 hours as needed.  - Staying in the shade or wearing long sleeves, sun glasses (UVA+UVB protection) and wide brim hats (4-inch brim around the entire circumference of the hat) are also recommended for sun protection.  - Call for new or changing lesions.  LENTIGINES, SEBORRHEIC KERATOSES, HEMANGIOMAS - Benign normal skin lesions - Benign-appearing - Call for any changes  MELANOCYTIC NEVI - Tan-brown and/or pink-flesh-colored symmetric macules and papules - Benign appearing on exam today - Observation - Call clinic for new or changing moles - Recommend daily  use of broad spectrum spf 30+ sunscreen to sun-exposed areas.   HISTORY OF MELANOMA -Right upper arm anterior -tx mohs 09/10/2023 - No evidence of recurrence today - Recommend regular full body skin exams - Recommend daily broad spectrum sunscreen SPF 30+ to sun-exposed areas, reapply every 2 hours as needed.  - Call if any new or changing lesions are noted between office visits   Healing wound s/p Mohs for Malignant Melanoma on the right upper arm-anterior, treated on 09/10/2023, repaired with linear closure with skin glue and steri strips.  - Reassured that wound has healed well - Discussed that scars take up to 12 months to mature from the date of surgery - Recommend SPF 30+ to scar daily to prevent purple color - OK to start scar massage at 4-6 weeks post-op - Can consider silicone based products for scar healing   Return in about 3 weeks (around 10/12/2023) for mohs follow up.  Maurine Sovereign, RN, am acting as scribe for Deneise Finlay, MD .  Documentation: I have reviewed the above documentation for accuracy and completeness, and I agree with the above.  Deneise Finlay, MD

## 2023-10-07 ENCOUNTER — Encounter: Payer: Self-pay | Admitting: Dermatology

## 2023-10-12 ENCOUNTER — Ambulatory Visit: Admitting: Dermatology

## 2023-10-19 ENCOUNTER — Ambulatory Visit (INDEPENDENT_AMBULATORY_CARE_PROVIDER_SITE_OTHER): Payer: Self-pay | Admitting: Dermatology

## 2023-10-19 ENCOUNTER — Encounter: Payer: Self-pay | Admitting: Dermatology

## 2023-10-19 DIAGNOSIS — Z85820 Personal history of malignant melanoma of skin: Secondary | ICD-10-CM | POA: Diagnosis not present

## 2023-10-19 DIAGNOSIS — L905 Scar conditions and fibrosis of skin: Secondary | ICD-10-CM | POA: Diagnosis not present

## 2023-10-19 DIAGNOSIS — L539 Erythematous condition, unspecified: Secondary | ICD-10-CM | POA: Diagnosis not present

## 2023-10-19 DIAGNOSIS — C439 Malignant melanoma of skin, unspecified: Secondary | ICD-10-CM

## 2023-10-19 NOTE — Progress Notes (Signed)
   Follow Up Visit   Subjective  Jennifer Nicholson is a 53 y.o. female who presents for the following: follow up from Mohs surgery   The patient presents for follow up from Mohs surgery for a MM on the right upper arm posterior, treated on 09/10/23, repaired with linear closure. The patient has been bandaging the wound as directed. The endorse the following concerns: none  The following portions of the chart were reviewed this encounter and updated as appropriate: medications, allergies, medical history  Review of Systems:  No other skin or systemic complaints except as noted in HPI or Assessment and Plan.  Objective  Well appearing patient in no apparent distress; mood and affect are within normal limits.  A full examination was performed including scalp, head, face and right upper arm posterior. All findings within normal limits unless otherwise noted below.  Healing wound with mild erythema  Relevant physical exam findings are noted in the Assessment and Plan.    Assessment & Plan    Healing s/p Mohs for MM, treated on 09/10/23, repaired with linear closure - Reassured that wound is healing well - No evidence of infection - No swelling, induration, purulence, dehiscence, or tenderness out of proportion to the clinical exam, see photo above - Discussed that scars take up to 12 months to mature from the date of surgery - Recommend SPF 30+ to scar daily to prevent purple color from UV exposure during scar maturation process - Discussed that erythema and raised appearance of scar will fade over the next 4-6 months - OK to start scar massage at 4-6 weeks post-op - Can consider silicone based products for scar healing starting at 6 weeks post-op  HISTORY OF MELANOMA - No evidence of recurrence today - No lymphadenopathy - Recommend regular full body skin exams - Recommend daily broad spectrum sunscreen SPF 30+ to sun-exposed areas, reapply every 2 hours as needed.  - Call if any new or  changing lesions are noted between office visits  Return in about 3 months (around 01/19/2024) for TBSE.  I, Wilson Hasten, CMA, am acting as scribe for Deneise Finlay, MD.   Documentation: I have reviewed the above documentation for accuracy and completeness, and I agree with the above.  Deneise Finlay, MD

## 2023-10-19 NOTE — Patient Instructions (Addendum)

## 2023-12-21 ENCOUNTER — Ambulatory Visit (INDEPENDENT_AMBULATORY_CARE_PROVIDER_SITE_OTHER): Admitting: Dermatology

## 2023-12-21 ENCOUNTER — Encounter: Payer: Self-pay | Admitting: Dermatology

## 2023-12-21 DIAGNOSIS — L905 Scar conditions and fibrosis of skin: Secondary | ICD-10-CM

## 2023-12-21 DIAGNOSIS — L821 Other seborrheic keratosis: Secondary | ICD-10-CM

## 2023-12-21 DIAGNOSIS — Z85820 Personal history of malignant melanoma of skin: Secondary | ICD-10-CM

## 2023-12-21 DIAGNOSIS — L578 Other skin changes due to chronic exposure to nonionizing radiation: Secondary | ICD-10-CM | POA: Diagnosis not present

## 2023-12-21 DIAGNOSIS — C439 Malignant melanoma of skin, unspecified: Secondary | ICD-10-CM

## 2023-12-21 DIAGNOSIS — D229 Melanocytic nevi, unspecified: Secondary | ICD-10-CM

## 2023-12-21 DIAGNOSIS — W908XXA Exposure to other nonionizing radiation, initial encounter: Secondary | ICD-10-CM

## 2023-12-21 DIAGNOSIS — Z1283 Encounter for screening for malignant neoplasm of skin: Secondary | ICD-10-CM

## 2023-12-21 DIAGNOSIS — D1801 Hemangioma of skin and subcutaneous tissue: Secondary | ICD-10-CM

## 2023-12-21 DIAGNOSIS — L814 Other melanin hyperpigmentation: Secondary | ICD-10-CM

## 2023-12-21 NOTE — Patient Instructions (Signed)
 Skin Education : We counseled the patient regarding the following: Sun screen (SPF 30 or greater) should be applied during peak UV exposure (between 10am and 2pm) and reapplied after exercise or swimming.  The ABCDEs of melanoma were reviewed with the patient, and the importance of monthly self-examination of moles was emphasized. Should any moles change in shape or color, or itch, bleed or burn, pt will contact our office for evaluation sooner then their interval appointment.  Plan: Sunscreen Recommendations We recommended a broad spectrum sunscreen with a SPF of 30 or higher. SPF 30 sunscreens block approximately 97 percent of the sun's harmful rays. Sunscreens should be applied at least 15 minutes prior to expected sun exposure and then every 2 hours after that as long as sun exposure continues. If swimming or exercising sunscreen should be reapplied every 45 minutes to an hour after getting wet or sweating. One ounce, or the equivalent of a shot glass full of sunscreen, is adequate to protect the skin not covered by a bathing suit. We also recommended a lip balm with a sunscreen as well. Sun protective clothing can be used in lieu of sunscreen but must be worn the entire time you are exposed to the sun's rays.   Important Information   Due to recent changes in healthcare laws, you may see results of your pathology and/or laboratory studies on MyChart before the doctors have had a chance to review them. We understand that in some cases there may be results that are confusing or concerning to you. Please understand that not all results are received at the same time and often the doctors may need to interpret multiple results in order to provide you with the best plan of care or course of treatment. Therefore, we ask that you please give us  2 business days to thoroughly review all your results before contacting the office for clarification. Should we see a critical lab result, you will be contacted  sooner.     If You Need Anything After Your Visit   If you have any questions or concerns for your doctor, please call our main line at [phone]. If no one answers, please leave a voicemail as directed and we will return your call as soon as possible. Messages left after 4 pm will be answered the following business day.    You may also send us  a message via MyChart. We typically respond to MyChart messages within 1-2 business days.  For prescription refills, please ask your pharmacy to contact our office. Our fax number is [phone].  If you have an urgent issue when the clinic is closed that cannot wait until the next business day, you can page your doctor at the number below.     Please note that while we do our best to be available for urgent issues outside of office hours, we are not available 24/7.    If you have an urgent issue and are unable to reach us , you may choose to seek medical care at your doctor's office, retail clinic, urgent care center, or emergency room.   If you have a medical emergency, please immediately call 911 or go to the emergency department. In the event of inclement weather, please call our main line at [phone] for an update on the status of any delays or closures.  Dermatology Medication Tips: Please keep the boxes that topical medications come in in order to help keep track of the instructions about where and how to use these. Pharmacies typically  print the medication instructions only on the boxes and not directly on the medication tubes.   If your medication is too expensive, please contact our office at [phone] or send us  a message through MyChart.    We are unable to tell what your co-pay for medications will be in advance as this is different depending on your insurance coverage. However, we may be able to find a substitute medication at lower cost or fill out paperwork to get insurance to cover a needed medication.    If a prior  authorization is required to get your medication covered by your insurance company, please allow us  1-2 business days to complete this process.   Drug prices often vary depending on where the prescription is filled and some pharmacies may offer cheaper prices.   The website www.goodrx.com contains coupons for medications through different pharmacies. The prices here do not account for what the cost may be with help from insurance (it may be cheaper with your insurance), but the website can give you the price if you did not use any insurance.  - You can print the associated coupon and take it with your prescription to the pharmacy.  - You may also stop by our office during regular business hours and pick up a GoodRx coupon card.  - If you need your prescription sent electronically to a different pharmacy, notify our office through [facility] or by phone at [phone]

## 2023-12-21 NOTE — Progress Notes (Signed)
   Follow-Up Visit   Subjective  Jennifer Nicholson is a 53 y.o. female who presents for the following: Skin Cancer Screening and Full Body Skin Exam  The patient presents for Total-Body Skin Exam (TBSE) for skin cancer screening and mole check. The patient has spots, moles and lesions to be evaluated, some may be new or changing.  Hx of MM- right arm- 2025  The following portions of the chart were reviewed this encounter and updated as appropriate: medications, allergies, medical history  Review of Systems:  No other skin or systemic complaints except as noted in HPI or Assessment and Plan.  Objective  Well appearing patient in no apparent distress; mood and affect are within normal limits.  A full examination was performed including scalp, head, eyes, ears, nose, lips, neck, chest, axillae, abdomen, back, buttocks, bilateral upper extremities, bilateral lower extremities, hands, feet, fingers, toes, fingernails, and toenails. All findings within normal limits unless otherwise noted below.   Relevant physical exam findings are noted in the Assessment and Plan.    Assessment & Plan   SKIN CANCER SCREENING PERFORMED TODAY.  ACTINIC DAMAGE - Chronic condition, secondary to cumulative UV/sun exposure - diffuse scaly erythematous macules with underlying dyspigmentation - Recommend daily broad spectrum sunscreen SPF 30+ to sun-exposed areas, reapply every 2 hours as needed.  - Staying in the shade or wearing long sleeves, sun glasses (UVA+UVB protection) and wide brim hats (4-inch brim around the entire circumference of the hat) are also recommended for sun protection.  - Call for new or changing lesions.  MELANOCYTIC NEVI - Tan-brown and/or pink-flesh-colored symmetric macules and papules - Benign appearing on exam today - Observation - Call clinic for new or changing moles - Recommend daily use of broad spectrum spf 30+ sunscreen to sun-exposed areas.   LENTIGINES Exam: scattered  tan macules Due to sun exposure Treatment Plan: Benign-appearing, observe. Recommend daily broad spectrum sunscreen SPF 30+ to sun-exposed areas, reapply every 2 hours as needed.  Call for any changes   HEMANGIOMA Exam: red papule(s) Discussed benign nature. Recommend observation. Call for changes.   SEBORRHEIC KERATOSIS - Stuck-on, waxy, tan-brown papules and/or plaques  - Benign-appearing - Discussed benign etiology and prognosis. - Observe - Call for any changes  HISTORY OF MELANOMA - No evidence of recurrence today - No lymphadenopathy - Recommend regular full body skin exams - Recommend daily broad spectrum sunscreen SPF 30+ to sun-exposed areas, reapply every 2 hours as needed.  - Call if any new or changing lesions are noted between office visits    Return in about 3 months (around 03/22/2024) for TBSE.  I, Darice Smock, CMA, am acting as scribe for RUFUS CHRISTELLA HOLY, MD.   Documentation: I have reviewed the above documentation for accuracy and completeness, and I agree with the above.  RUFUS CHRISTELLA HOLY, MD

## 2024-03-21 ENCOUNTER — Ambulatory Visit: Admitting: Dermatology
